# Patient Record
Sex: Female | Born: 1945 | Race: White | Hispanic: No | Marital: Married | Smoking: Former smoker
Health system: Southern US, Community
[De-identification: ages and names within clinical notes are randomized; demographics above are authoritative.]

## PROBLEM LIST (undated history)

## (undated) DIAGNOSIS — K589 Irritable bowel syndrome without diarrhea: Secondary | ICD-10-CM

## (undated) DIAGNOSIS — E119 Type 2 diabetes mellitus without complications: Secondary | ICD-10-CM

## (undated) DIAGNOSIS — N200 Calculus of kidney: Secondary | ICD-10-CM

## (undated) DIAGNOSIS — E7439 Other disorders of intestinal carbohydrate absorption: Secondary | ICD-10-CM

## (undated) DIAGNOSIS — N943 Premenstrual tension syndrome: Secondary | ICD-10-CM

## (undated) DIAGNOSIS — Z9889 Other specified postprocedural states: Secondary | ICD-10-CM

## (undated) DIAGNOSIS — T7840XA Allergy, unspecified, initial encounter: Secondary | ICD-10-CM

## (undated) DIAGNOSIS — J302 Other seasonal allergic rhinitis: Secondary | ICD-10-CM

## (undated) DIAGNOSIS — R51 Headache: Secondary | ICD-10-CM

## (undated) DIAGNOSIS — K219 Gastro-esophageal reflux disease without esophagitis: Secondary | ICD-10-CM

## (undated) DIAGNOSIS — E781 Pure hyperglyceridemia: Secondary | ICD-10-CM

## (undated) DIAGNOSIS — E049 Nontoxic goiter, unspecified: Secondary | ICD-10-CM

## (undated) DIAGNOSIS — R112 Nausea with vomiting, unspecified: Secondary | ICD-10-CM

## (undated) DIAGNOSIS — K5790 Diverticulosis of intestine, part unspecified, without perforation or abscess without bleeding: Secondary | ICD-10-CM

## (undated) DIAGNOSIS — K648 Other hemorrhoids: Secondary | ICD-10-CM

## (undated) DIAGNOSIS — I10 Essential (primary) hypertension: Secondary | ICD-10-CM

## (undated) DIAGNOSIS — K7689 Other specified diseases of liver: Secondary | ICD-10-CM

## (undated) DIAGNOSIS — Z90710 Acquired absence of both cervix and uterus: Secondary | ICD-10-CM

## (undated) DIAGNOSIS — M1990 Unspecified osteoarthritis, unspecified site: Secondary | ICD-10-CM

## (undated) HISTORY — DX: Type 2 diabetes mellitus without complications: E11.9

## (undated) HISTORY — DX: Premenstrual tension syndrome: N94.3

## (undated) HISTORY — DX: Other seasonal allergic rhinitis: J30.2

## (undated) HISTORY — DX: Nontoxic goiter, unspecified: E04.9

## (undated) HISTORY — DX: Gastro-esophageal reflux disease without esophagitis: K21.9

## (undated) HISTORY — DX: Other disorders of intestinal carbohydrate absorption: E74.39

## (undated) HISTORY — PX: BREAST BIOPSY: SHX20

## (undated) HISTORY — PX: ABDOMINAL HYSTERECTOMY: SHX81

## (undated) HISTORY — DX: Headache: R51

## (undated) HISTORY — DX: Other specified diseases of liver: K76.89

## (undated) HISTORY — DX: Pure hyperglyceridemia: E78.1

## (undated) HISTORY — DX: Acquired absence of both cervix and uterus: Z90.710

## (undated) HISTORY — DX: Allergy, unspecified, initial encounter: T78.40XA

## (undated) HISTORY — DX: Irritable bowel syndrome, unspecified: K58.9

## (undated) HISTORY — DX: Diverticulosis of intestine, part unspecified, without perforation or abscess without bleeding: K57.90

## (undated) HISTORY — PX: TONSILLECTOMY: SUR1361

## (undated) HISTORY — DX: Calculus of kidney: N20.0

## (undated) HISTORY — DX: Unspecified osteoarthritis, unspecified site: M19.90

## (undated) HISTORY — DX: Other hemorrhoids: K64.8

## (undated) HISTORY — PX: OTHER SURGICAL HISTORY: SHX169

## (undated) HISTORY — DX: Essential (primary) hypertension: I10

---

## 1973-07-20 HISTORY — PX: KIDNEY STONE SURGERY: SHX686

## 1983-07-21 HISTORY — PX: TOTAL ABDOMINAL HYSTERECTOMY W/ BILATERAL SALPINGOOPHORECTOMY: SHX83

## 1993-07-20 HISTORY — PX: TEMPOROMANDIBULAR JOINT SURGERY: SHX35

## 2001-10-18 ENCOUNTER — Ambulatory Visit (HOSPITAL_COMMUNITY): Admission: RE | Admit: 2001-10-18 | Discharge: 2001-10-18 | Payer: Self-pay | Admitting: Family Medicine

## 2001-10-18 ENCOUNTER — Encounter: Payer: Self-pay | Admitting: Family Medicine

## 2002-11-20 ENCOUNTER — Encounter: Payer: Self-pay | Admitting: Emergency Medicine

## 2002-11-20 ENCOUNTER — Emergency Department (HOSPITAL_COMMUNITY): Admission: EM | Admit: 2002-11-20 | Discharge: 2002-11-20 | Payer: Self-pay | Admitting: Emergency Medicine

## 2002-11-21 ENCOUNTER — Ambulatory Visit (HOSPITAL_BASED_OUTPATIENT_CLINIC_OR_DEPARTMENT_OTHER): Admission: RE | Admit: 2002-11-21 | Discharge: 2002-11-21 | Payer: Self-pay | Admitting: Urology

## 2005-03-18 ENCOUNTER — Ambulatory Visit: Payer: Self-pay | Admitting: Family Medicine

## 2005-03-25 ENCOUNTER — Ambulatory Visit: Payer: Self-pay | Admitting: Family Medicine

## 2005-04-07 ENCOUNTER — Ambulatory Visit: Payer: Self-pay | Admitting: Family Medicine

## 2005-07-20 DIAGNOSIS — K5790 Diverticulosis of intestine, part unspecified, without perforation or abscess without bleeding: Secondary | ICD-10-CM

## 2005-07-20 HISTORY — DX: Diverticulosis of intestine, part unspecified, without perforation or abscess without bleeding: K57.90

## 2005-09-15 ENCOUNTER — Ambulatory Visit: Payer: Self-pay | Admitting: Family Medicine

## 2006-10-24 ENCOUNTER — Ambulatory Visit (HOSPITAL_COMMUNITY): Admission: RE | Admit: 2006-10-24 | Discharge: 2006-10-24 | Payer: Self-pay | Admitting: Family Medicine

## 2007-02-16 ENCOUNTER — Encounter (INDEPENDENT_AMBULATORY_CARE_PROVIDER_SITE_OTHER): Payer: Self-pay | Admitting: *Deleted

## 2007-03-14 DIAGNOSIS — I10 Essential (primary) hypertension: Secondary | ICD-10-CM

## 2007-03-14 DIAGNOSIS — R51 Headache: Secondary | ICD-10-CM

## 2007-03-14 DIAGNOSIS — R519 Headache, unspecified: Secondary | ICD-10-CM | POA: Insufficient documentation

## 2007-03-14 DIAGNOSIS — K219 Gastro-esophageal reflux disease without esophagitis: Secondary | ICD-10-CM | POA: Insufficient documentation

## 2007-05-03 ENCOUNTER — Ambulatory Visit: Payer: Self-pay | Admitting: Family Medicine

## 2007-11-02 ENCOUNTER — Telehealth (INDEPENDENT_AMBULATORY_CARE_PROVIDER_SITE_OTHER): Payer: Self-pay | Admitting: *Deleted

## 2007-12-02 ENCOUNTER — Ambulatory Visit: Payer: Self-pay | Admitting: Family Medicine

## 2007-12-02 LAB — CONVERTED CEMR LAB
ALT: 34 units/L (ref 0–35)
AST: 28 units/L (ref 0–37)
Albumin: 3.9 g/dL (ref 3.5–5.2)
Alkaline Phosphatase: 63 units/L (ref 39–117)
BUN: 16 mg/dL (ref 6–23)
Basophils Absolute: 0 10*3/uL (ref 0.0–0.1)
Basophils Relative: 0.6 % (ref 0.0–1.0)
Bilirubin Urine: NEGATIVE
Bilirubin, Direct: 0.1 mg/dL (ref 0.0–0.3)
Blood in Urine, dipstick: NEGATIVE
CO2: 32 meq/L (ref 19–32)
Calcium: 9.2 mg/dL (ref 8.4–10.5)
Chloride: 106 meq/L (ref 96–112)
Cholesterol: 173 mg/dL (ref 0–200)
Creatinine, Ser: 0.7 mg/dL (ref 0.4–1.2)
Direct LDL: 96 mg/dL
Eosinophils Absolute: 0.2 10*3/uL (ref 0.0–0.7)
Eosinophils Relative: 2.4 % (ref 0.0–5.0)
GFR calc Af Amer: 110 mL/min
GFR calc non Af Amer: 91 mL/min
Glucose, Bld: 104 mg/dL — ABNORMAL HIGH (ref 70–99)
Glucose, Urine, Semiquant: NEGATIVE
HCT: 41 % (ref 36.0–46.0)
HDL: 30.6 mg/dL — ABNORMAL LOW (ref >39.0)
Hemoglobin: 13.7 g/dL (ref 12.0–15.0)
Ketones, urine, test strip: NEGATIVE
Lymphocytes Relative: 25.8 % (ref 12.0–46.0)
MCHC: 33.3 g/dL (ref 30.0–36.0)
MCV: 96.8 fL (ref 78.0–100.0)
Monocytes Absolute: 0.5 10*3/uL (ref 0.1–1.0)
Monocytes Relative: 6.7 % (ref 3.0–12.0)
Neutro Abs: 4.9 10*3/uL (ref 1.4–7.7)
Neutrophils Relative %: 64.5 % (ref 43.0–77.0)
Nitrite: NEGATIVE
Platelets: 220 10*3/uL (ref 150–400)
Potassium: 3.6 meq/L (ref 3.5–5.1)
Protein, U semiquant: NEGATIVE
RBC: 4.24 M/uL (ref 3.87–5.11)
RDW: 11.5 % (ref 11.5–14.6)
Sodium: 144 meq/L (ref 135–145)
Specific Gravity, Urine: 1.015
TSH: 1.59 microintl units/mL (ref 0.35–5.50)
Total Bilirubin: 0.9 mg/dL (ref 0.3–1.2)
Total CHOL/HDL Ratio: 5.7
Total Protein: 6.7 g/dL (ref 6.0–8.3)
Triglycerides: 231 mg/dL (ref 0–149)
Urobilinogen, UA: 0.2
VLDL: 46 mg/dL — ABNORMAL HIGH (ref 0–40)
WBC Urine, dipstick: NEGATIVE
WBC: 7.5 10*3/uL (ref 4.5–10.5)
pH: 7

## 2007-12-09 ENCOUNTER — Ambulatory Visit: Payer: Self-pay | Admitting: Family Medicine

## 2009-12-20 ENCOUNTER — Ambulatory Visit: Payer: Self-pay | Admitting: Family Medicine

## 2010-01-03 ENCOUNTER — Ambulatory Visit: Payer: Self-pay | Admitting: Family Medicine

## 2010-03-07 ENCOUNTER — Encounter: Payer: Self-pay | Admitting: Family Medicine

## 2010-08-17 LAB — CONVERTED CEMR LAB
ALT: 21 units/L (ref 0–35)
AST: 22 units/L (ref 0–37)
Albumin: 4.4 g/dL (ref 3.5–5.2)
Alkaline Phosphatase: 54 units/L (ref 39–117)
BUN: 15 mg/dL (ref 6–23)
Basophils Absolute: 0 10*3/uL (ref 0.0–0.1)
Basophils Relative: 0.6 % (ref 0.0–3.0)
Bilirubin Urine: NEGATIVE
Bilirubin, Direct: 0.1 mg/dL (ref 0.0–0.3)
CO2: 33 meq/L — ABNORMAL HIGH (ref 19–32)
Calcium: 9.7 mg/dL (ref 8.4–10.5)
Chloride: 103 meq/L (ref 96–112)
Cholesterol: 180 mg/dL (ref 0–200)
Creatinine, Ser: 0.6 mg/dL (ref 0.4–1.2)
Direct LDL: 105.6 mg/dL
Eosinophils Absolute: 0.2 10*3/uL (ref 0.0–0.7)
Eosinophils Relative: 1.9 % (ref 0.0–5.0)
GFR calc non Af Amer: 107.4 mL/min (ref >60)
Glucose, Bld: 80 mg/dL (ref 70–99)
Glucose, Urine, Semiquant: NEGATIVE
HCT: 40.9 % (ref 36.0–46.0)
HDL: 40.4 mg/dL (ref >39.00)
Hemoglobin: 14.2 g/dL (ref 12.0–15.0)
Ketones, urine, test strip: NEGATIVE
Lymphocytes Relative: 24.4 % (ref 12.0–46.0)
Lymphs Abs: 2.1 10*3/uL (ref 0.7–4.0)
MCHC: 34.7 g/dL (ref 30.0–36.0)
MCV: 96.9 fL (ref 78.0–100.0)
Monocytes Absolute: 0.6 10*3/uL (ref 0.1–1.0)
Monocytes Relative: 7.1 % (ref 3.0–12.0)
Neutro Abs: 5.7 10*3/uL (ref 1.4–7.7)
Neutrophils Relative %: 66 % (ref 43.0–77.0)
Nitrite: NEGATIVE
Platelets: 236 10*3/uL (ref 150.0–400.0)
Potassium: 3.5 meq/L (ref 3.5–5.1)
Protein, U semiquant: NEGATIVE
RBC: 4.22 M/uL (ref 3.87–5.11)
RDW: 12.7 % (ref 11.5–14.6)
Sodium: 145 meq/L (ref 135–145)
Specific Gravity, Urine: 1.01
TSH: 1.76 microintl units/mL (ref 0.35–5.50)
Total Bilirubin: 0.9 mg/dL (ref 0.3–1.2)
Total CHOL/HDL Ratio: 4
Total Protein: 7.2 g/dL (ref 6.0–8.3)
Triglycerides: 308 mg/dL — ABNORMAL HIGH (ref 0.0–149.0)
Urobilinogen, UA: 0.2
VLDL: 61.6 mg/dL — ABNORMAL HIGH (ref 0.0–40.0)
WBC Urine, dipstick: NEGATIVE
WBC: 8.6 10*3/uL (ref 4.5–10.5)
pH: 7

## 2010-08-21 NOTE — Assessment & Plan Note (Signed)
Summary: MED CK (REFILL) // RS   Vital Signs:  Patient profile:   65 year old female Height:      63 inches Weight:      158 pounds BMI:     28.09 Temp:     99.1 degrees F oral BP sitting:   110 / 80  (left arm) Cuff size:   regular  Vitals Entered By: Kern Reap CMA Duncan Dull) (December 20, 2009 11:18 AM) CC: follow-up visit    CC:  follow-up visit .  History of Present Illness: Michelle Spears is a 65 year old, widowed female, nonsmoker........... her husband ken died 5 years ago........ who comes in today for evaluation of hypertension.  She takes Tenoretic 50 to 25 one half tab daily BP 110/80.  Her last eye exam was two years ago, no dental care, she does BSE monthly.  However, last mammogram was 2006.  She's never had a colonoscopy.  Tetanus booster unknown.  We discussed her health maintenance activities in detail.  She states she has a new job and will soon have insurance.  She knows she needs the above done, however, has not done them because she didn't have the money  Allergies: No Known Drug Allergies  Review of Systems      See HPI  Physical Exam  General:  Well-developed,well-nourished,in no acute distress; alert,appropriate and cooperative throughout examination   Impression & Recommendations:  Problem # 1:  HYPERTENSION (ICD-401.9) Assessment Improved  Her updated medication list for this problem includes:    Atenolol-chlorthalidone 50-25 Mg Tabs (Atenolol-chlorthalidone) .Marland Kitchen... 1/2 tablet by mouth once in the am need cpx asap  Complete Medication List: 1)  Atenolol-chlorthalidone 50-25 Mg Tabs (Atenolol-chlorthalidone) .... 1/2 tablet by mouth once in the am need cpx asap 2)  Potassium Chloride Crys Cr 20 Meq Tbcr (Potassium chloride crys cr) .... Take 1 tablet by mouth every morning  Patient Instructions: 1)  continue your blood pressure medication. 2)  Return Friday, June 17 for a 30 minute appointment.  We will do your lab work the same day, you come  in Prescriptions: ATENOLOL-CHLORTHALIDONE 50-25 MG TABS (ATENOLOL-CHLORTHALIDONE) 1/2 tablet by mouth once in the am need cpx asap  #50 x 4   Entered and Authorized by:   Roderick Pee MD   Signed by:   Roderick Pee MD on 12/20/2009   Method used:   Electronically to        [facility]* (retail)       [address] [place], [place]  [address]       Ph: [phone]       Fax: [phone]   RxID:   [id number]

## 2010-08-21 NOTE — Assessment & Plan Note (Signed)
Summary: LABWORK & F/U PER DR Dezi Brauner // RS   Vital Signs:  Patient profile:   65 year old female Menstrual status:  hysterectomy Height:      63 inches Weight:      161 pounds Temp:     99.1 degrees F oral BP sitting:   130 / 90  (left arm) Cuff size:   regular  Vitals Entered By: Kern Reap CMA Duncan Dull) (January 03, 2010 10:01 AM) CC: follow-up visit     Menstrual Status hysterectomy   CC:  follow-up visit.  History of Present Illness: Michelle Spears is a 65 year old, widowed female, nonsmoker, who comes in today for evaluation of hypertension.  She takes Tenoretic 50 -- 25 dose one half tab daily for hypertension.  BP 130/90.  She gets routine eye care and dental care.  She does not check her breasts monthly and last mammogram was 7 years ago.  She's never had a colonoscopy.  Tetanus 2009.  She declines flu shots for unknown reasons.  Advised to get an annual flu shot.  Also asked her to call to get set up for screening mammogram and we will get her set up for a screening colonoscopy.  Allergies: No Known Drug Allergies  Past History:  Past medical, surgical, family and social histories (including risk factors) reviewed, and no changes noted (except as noted below).  Past Medical History: Reviewed history from 12/09/2007 and no changes required. GERD Headache Hypertension PMS TAB Abuse hysterectomy for nonmalignant reasons childbirth x 2 kidney stone diverticulitis thyroiditis  Past Surgical History: Reviewed history from 03/14/2007 and no changes required. TAH/BSO Childbirth x 2 TMJ Surg Kidney Stone-2004  Family History: Reviewed history from 03/14/2007 and no changes required. Father: MI,HBP.Obesity Mother:Hyperlipidemia Siblings: Hyperlipidemia  Social History: Reviewed history from 12/09/2007 and no changes required. Former Smoker Occupation: Alcohol use-no Drug use-no Regular exercise-yes husband can died two years ago from bacteremic shock  Review of  Systems      See HPI  Physical Exam  General:  Well-developed,well-nourished,in no acute distress; alert,appropriate and cooperative throughout examination Head:  Normocephalic and atraumatic without obvious abnormalities. No apparent alopecia or balding. Eyes:  No corneal or conjunctival inflammation noted. EOMI. Perrla. Funduscopic exam benign, without hemorrhages, exudates or papilledema. Vision grossly normal. Ears:  External ear exam shows no significant lesions or deformities.  Otoscopic examination reveals clear canals, tympanic membranes are intact bilaterally without bulging, retraction, inflammation or discharge. Hearing is grossly normal bilaterally. Nose:  External nasal examination shows no deformity or inflammation. Nasal mucosa are pink and moist without lesions or exudates. Mouth:  Oral mucosa and oropharynx without lesions or exudates.  Teeth in good repair. Neck:  No deformities, masses, or tenderness noted. Chest Wall:  No deformities, masses, or tenderness noted. Breasts:  No mass, nodules, thickening, tenderness, bulging, retraction, inflamation, nipple discharge or skin changes noted.   Lungs:  Normal respiratory effort, chest expands symmetrically. Lungs are clear to auscultation, no crackles or wheezes. Heart:  Normal rate and regular rhythm. S1 and S2 normal without gallop, murmur, click, rub or other extra sounds. Abdomen:  Bowel sounds positive,abdomen soft and non-tender without masses, organomegaly or hernias noted. Rectal:  No external abnormalities noted. Normal sphincter tone. No rectal masses or tenderness. Genitalia:  Pelvic Exam:        External: normal female genitalia without lesions or masses        Vagina: normal without lesions or masses        Cervix: normal without lesions  or masses        Adnexa: normal bimanual exam without masses or fullness        Uterus: normal by palpation        Pap smear: not performed Msk:  No deformity or scoliosis noted of  thoracic or lumbar spine.   Pulses:  R and L carotid,radial,femoral,dorsalis pedis and posterior tibial pulses are full and equal bilaterally Extremities:  No clubbing, cyanosis, edema, or deformity noted with normal full range of motion of all joints.   Neurologic:  No cranial nerve deficits noted. Station and gait are normal. Plantar reflexes are down-going bilaterally. DTRs are symmetrical throughout. Sensory, motor and coordinative functions appear intact. Skin:  Intact without suspicious lesions or rashes Cervical Nodes:  No lymphadenopathy noted Axillary Nodes:  No palpable lymphadenopathy Inguinal Nodes:  No significant adenopathy Psych:  Cognition and judgment appear intact. Alert and cooperative with normal attention span and concentration. No apparent delusions, illusions, hallucinations   Impression & Recommendations:  Problem # 1:  Preventive Health Care (ICD-V70.0) Assessment Unchanged  Problem # 2:  HYPERTENSION (ICD-401.9) Assessment: Improved  Her updated medication list for this problem includes:    Atenolol-chlorthalidone 50-25 Mg Tabs (Atenolol-chlorthalidone) .Marland Kitchen... 1/2 tablet by mouth once in the am need cpx asap  Orders: Venipuncture (91478) TLB-Lipid Panel (80061-LIPID) TLB-BMP (Basic Metabolic Panel-BMET) (80048-METABOL) TLB-CBC Platelet - w/Differential (85025-CBCD) TLB-Hepatic/Liver Function Pnl (80076-HEPATIC) TLB-TSH (Thyroid Stimulating Hormone) (29562-ZHY) Prescription Created Electronically [phone] UA Dipstick w/o Micro (automated)  (81003) EKG w/ Interpretation (93000)  Complete Medication List: 1)  Atenolol-chlorthalidone 50-25 Mg Tabs (Atenolol-chlorthalidone) .... 1/2 tablet by mouth once in the am need cpx asap  Other Orders: Gastroenterology Referral (GI)  Patient Instructions: 1)  continue current medication. 2)  Take one baby aspirin daily 3)  walk 20 minutes daily 4)  Schedule your mammogram. 5)  Schedule a colonoscopy/sigmoidoscopy  to help detect colon cancer. 6)  Take calcium +Vitamin D daily.    Laboratory Results   Urine Tests    Routine Urinalysis   Color: yellow Appearance: Clear Glucose: negative   (Normal Range: Negative) Bilirubin: negative   (Normal Range: Negative) Ketone: negative   (Normal Range: Negative) Spec. Gravity: 1.010   (Normal Range: 1.003-1.035) Blood: trace-intact   (Normal Range: Negative) pH: 7.0   (Normal Range: 5.0-8.0) Protein: negative   (Normal Range: Negative) Urobilinogen: 0.2   (Normal Range: 0-1) Nitrite: negative   (Normal Range: Negative) Leukocyte Esterace: negative   (Normal Range: Negative)    Comments: Rita Ohara  January 03, 2010 10:47 AM     Appended Document: LABWORK & F/U PER DR Ernestina Joe // RS please call labs normal  Appended Document: LABWORK & F/U PER DR Savas Elvin // RS left message on machine for patient

## 2010-08-21 NOTE — Letter (Signed)
Summary: Referral - not able to see patient  [facility]  [address] [place], [place] [address]   Phone: [phone]  Fax: [phone]    March 07, 2010    Tinnie Gens A. Tawanna Cooler, M.D. [address] [place], [place] [address]   Re:   Michelle Spears DOB:  January 10, 1946 MRN:   [id number]    Dear Dr. Tawanna Cooler:  Thank you for your kind referral of the above patient.  We have attempted to schedule the recommended procedure Screening Colonoscopy but have not been able to schedule because:   X  The patient was not available by phone and/or has not returned our calls.  ___ The patient declined to schedule the procedure at this time.  We appreciate the referral and hope that we will have the opportunity to treat this patient in the future.    Sincerely,    [facility] [phone]

## 2010-12-05 NOTE — Op Note (Signed)
NAME:  Michelle Spears, Michelle Spears                        ACCOUNT NO.:  [id number]   MEDICAL RECORD NO.:  [id number]                   PATIENT TYPE:  AMB   LOCATION:  [facility]                                 FACILITY:  [facility]   PHYSICIAN:  Jamison Neighbor, M.D.               DATE OF BIRTH:  April 09, 1946   DATE OF PROCEDURE:  11/21/2002  DATE OF DISCHARGE:                                 OPERATIVE REPORT   SERVICE:  Urology.   PREOPERATIVE DIAGNOSIS:  Left ureteral calculus with hydronephrosis.   POSTOPERATIVE DIAGNOSIS:  Left ureteral calculus with hydronephrosis.   PROCEDURE:  Cystoscopy, left retrograde ureteropyelogram including  interpretation, left ureteroscopy, left stone extraction and left double J  catheter insertion.   SURGEON:  Jamison Neighbor, M.D.   ANESTHESIA:  General.   COMPLICATIONS:  None.   DRAINS:  6 French x 26 cm double J catheter.   BRIEF HISTORY:  This 65 year old female presented to the emergency room on  Nov 20, 2002 with left sided pain and was found on evaluation to have a 7 mm  stone impacted at the junction of the middle third and bottom third of the  ureter. The patient has been unable to pass the stone and is now to undergo  ureteroscopy with basket extraction and possible in situ laser lithotripsy.  The patient understands the risks and benefits of the procedure and gave  full and informed consent.   DESCRIPTION OF PROCEDURE:  After successful induction of general anesthesia,  the patient was placed in the dorsal lithotomy position, prepped with  Betadine and draped in the usual sterile fashion. Cystoscopy was performed  and the bladder was carefully inspected and was free of any tumor or stones.  Both ureteral orifices were normal in configuration and location. Retrograde  on the left hand side showed a filling defect at the junction of the mid and  lower third of the ureter consistent with a stone. The upper tract showed  hydronephrosis but no filling  defect. A guidewire was passed up to the  kidney where it coiled normally in the upper pole. The distal ureter was  dilated with a 10 cm balloon dilator because this was a very tight and  stenotic ureteral opening. The cystoscope and the balloon dilator were  removed, the guidewire was left in place, the ureteroscope was inserted  along side the guidewire and advanced to the level of the stones, some clot  was washed away and the stone was well visualized. A nitinol basket was then  passed around the stone which was secured, the stone was extracted under  direct vision with no injury to the ureter. The ureteroscope was then  reintroduced and passed along side the guidewire all the way up into the  collecting system, no other stones could be seen and no abnormalities were  detected. The bladder was drained, the double J catheter was passed up to  the kidney where it was allowed to coil normally and the collecting system  as  well as within the bladder. The patient tolerated the procedure well and was  taken to the recovery room in good condition. She will use the pain medicine  she has at home, she will be given a small number of Pyridium plus to use  for any symptom management and will be sent home on antibiotics, one daily.                                               Jamison Neighbor, M.D.    RJE/MEDQ  D:  11/21/2002  T:  11/21/2002  Job:  [id number]

## 2011-04-28 ENCOUNTER — Telehealth: Payer: Self-pay | Admitting: Family Medicine

## 2011-04-28 NOTE — Telephone Encounter (Signed)
Pt stated she is going to [place] on 08/13/11. Pt has an appt on 04/30/11 and we will discuss immunizations then.

## 2011-04-28 NOTE — Telephone Encounter (Signed)
Pt will be traveling out of the country at the beginning of the year and had some questions about immunizations she would need to get. Please contact pt

## 2011-04-29 ENCOUNTER — Encounter: Payer: Self-pay | Admitting: Family Medicine

## 2011-04-30 ENCOUNTER — Ambulatory Visit (INDEPENDENT_AMBULATORY_CARE_PROVIDER_SITE_OTHER): Payer: 59 | Admitting: Family Medicine

## 2011-04-30 ENCOUNTER — Encounter: Payer: Self-pay | Admitting: Family Medicine

## 2011-04-30 DIAGNOSIS — I10 Essential (primary) hypertension: Secondary | ICD-10-CM

## 2011-04-30 NOTE — Progress Notes (Signed)
Subjective:    Patient ID: Michelle Spears, female    DOB: 01/20/1946, 65 y.o.   MRN: [id number]  HPI  Michelle Spears Is a 65 year old female, nonsmoker, who comes in today for reevaluation of hypertension.  She is on Tenoretic 50 -- 25 dose one half tab daily BP 122/76.  She feels well.  She is not lightheaded.  She states she is going on a mission trip.  The [place] and would like to know about vaccinations.  She would like to know what dose of Motrin to take for osteoarthritis.  Recommended 400 mg b.i.d. With food.    Review of Systems    General and cardiovascular review of systems otherwise negative Objective:   Physical Exam  Well-developed well-nourished, female in no acute distress.  BP right arm sitting position 122/76, pulse 70 and regular      Assessment & Plan:  Hypertension a goal continue current therapy.  Recommend she checked the [facility] website

## 2011-04-30 NOTE — Patient Instructions (Signed)
Continue the Tenoretic one half tablet daily.  Check with the CDC website to see what vaccinations, you need to go to [place].  Return annually for uric general physical exam, sooner if any problems

## 2011-05-04 ENCOUNTER — Other Ambulatory Visit: Payer: Self-pay | Admitting: Family Medicine

## 2011-06-29 ENCOUNTER — Telehealth: Payer: Self-pay | Admitting: Family Medicine

## 2011-06-29 NOTE — Telephone Encounter (Signed)
Pt is taking a trip to [place] in January and is needing to come in for tdap, mmr, and ?hep a inj. Pt has some questions re: vaccines and how far in advance does she need to have vaxs administered?

## 2011-07-01 NOTE — Telephone Encounter (Signed)
Spoke with patient and appointment made

## 2011-07-02 ENCOUNTER — Ambulatory Visit: Payer: 59 | Admitting: Family Medicine

## 2011-07-03 ENCOUNTER — Ambulatory Visit: Payer: 59 | Admitting: Family Medicine

## 2011-07-03 DIAGNOSIS — Z23 Encounter for immunization: Secondary | ICD-10-CM

## 2011-07-06 ENCOUNTER — Encounter: Payer: Self-pay | Admitting: Family Medicine

## 2011-07-06 NOTE — Progress Notes (Signed)
Subjective:    Patient ID: Michelle Spears, female    DOB: [birth year], 65 y.o.   MRN: [id number]  HPI Michelle Spears Is a 65 year old, married female, who comes in today to get a hepatitis A vaccine, because she's going overseas.  She is going to [place].  Hepatitis A was given booster in 6 months   Review of Systems    Noncontributory Objective:   Physical Exam No exam done vaccination only       Assessment & Plan:  Hepatitis A vaccine for foreign travel.  Return p.r.n.

## 2011-07-21 HISTORY — PX: COLONOSCOPY: SHX174

## 2011-08-29 ENCOUNTER — Emergency Department (INDEPENDENT_AMBULATORY_CARE_PROVIDER_SITE_OTHER): Payer: 59

## 2011-08-29 ENCOUNTER — Emergency Department (HOSPITAL_BASED_OUTPATIENT_CLINIC_OR_DEPARTMENT_OTHER)
Admission: EM | Admit: 2011-08-29 | Discharge: 2011-08-29 | Disposition: A | Discharge status: Home / Self Care | Payer: 59 | Attending: Emergency Medicine | Admitting: Emergency Medicine

## 2011-08-29 ENCOUNTER — Encounter (HOSPITAL_BASED_OUTPATIENT_CLINIC_OR_DEPARTMENT_OTHER): Payer: Self-pay | Admitting: Emergency Medicine

## 2011-08-29 DIAGNOSIS — R109 Unspecified abdominal pain: Secondary | ICD-10-CM | POA: Insufficient documentation

## 2011-08-29 DIAGNOSIS — R197 Diarrhea, unspecified: Secondary | ICD-10-CM

## 2011-08-29 DIAGNOSIS — I10 Essential (primary) hypertension: Secondary | ICD-10-CM | POA: Insufficient documentation

## 2011-08-29 DIAGNOSIS — Z79899 Other long term (current) drug therapy: Secondary | ICD-10-CM | POA: Insufficient documentation

## 2011-08-29 DIAGNOSIS — R112 Nausea with vomiting, unspecified: Secondary | ICD-10-CM | POA: Insufficient documentation

## 2011-08-29 DIAGNOSIS — K5730 Diverticulosis of large intestine without perforation or abscess without bleeding: Secondary | ICD-10-CM | POA: Insufficient documentation

## 2011-08-29 DIAGNOSIS — K7689 Other specified diseases of liver: Secondary | ICD-10-CM

## 2011-08-29 DIAGNOSIS — K219 Gastro-esophageal reflux disease without esophagitis: Secondary | ICD-10-CM | POA: Insufficient documentation

## 2011-08-29 LAB — COMPREHENSIVE METABOLIC PANEL
AST: 20 U/L (ref 0–37)
CO2: 27 mEq/L (ref 19–32)
Chloride: 97 mEq/L (ref 96–112)
Creatinine, Ser: 0.7 mg/dL (ref 0.50–1.10)
GFR calc non Af Amer: 90 mL/min — ABNORMAL LOW (ref >90)
Glucose, Bld: 134 mg/dL — ABNORMAL HIGH (ref 70–99)
Total Bilirubin: 0.8 mg/dL (ref 0.3–1.2)

## 2011-08-29 LAB — URINE MICROSCOPIC-ADD ON

## 2011-08-29 LAB — URINALYSIS, ROUTINE W REFLEX MICROSCOPIC
Bilirubin Urine: NEGATIVE
Nitrite: NEGATIVE
Specific Gravity, Urine: 1.025 (ref 1.005–1.030)
Urobilinogen, UA: 0.2 mg/dL (ref 0.0–1.0)
pH: 6.5 (ref 5.0–8.0)

## 2011-08-29 LAB — OCCULT BLOOD X 1 CARD TO LAB, STOOL: Fecal Occult Bld: NEGATIVE

## 2011-08-29 LAB — CBC
HCT: 39.9 % (ref 36.0–46.0)
Hemoglobin: 14.2 g/dL (ref 12.0–15.0)
RBC: 4.34 MIL/uL (ref 3.87–5.11)
RDW: 12.6 % (ref 11.5–15.5)
WBC: 9.1 10*3/uL (ref 4.0–10.5)

## 2011-08-29 LAB — CLOSTRIDIUM DIFFICILE BY PCR: Toxigenic C. Difficile by PCR: NEGATIVE

## 2011-08-29 LAB — DIFFERENTIAL
Basophils Absolute: 0 10*3/uL (ref 0.0–0.1)
Lymphocytes Relative: 6 % — ABNORMAL LOW (ref 12–46)
Lymphs Abs: 0.5 10*3/uL — ABNORMAL LOW (ref 0.7–4.0)
Monocytes Absolute: 0.5 10*3/uL (ref 0.1–1.0)
Monocytes Relative: 6 % (ref 3–12)
Neutro Abs: 8 10*3/uL — ABNORMAL HIGH (ref 1.7–7.7)

## 2011-08-29 LAB — LACTIC ACID, PLASMA: Lactic Acid, Venous: 1.5 mmol/L (ref 0.5–2.2)

## 2011-08-29 MED ORDER — IOHEXOL 300 MG/ML  SOLN
100.0000 mL | Freq: Once | INTRAMUSCULAR | Status: AC | PRN
  Administered 2011-08-29: 100 mL via INTRAVENOUS

## 2011-08-29 MED ORDER — IOHEXOL 300 MG/ML  SOLN
20.0000 mL | INTRAMUSCULAR | Status: AC
  Administered 2011-08-29 (×2): 20 mL via ORAL

## 2011-08-29 MED ORDER — ONDANSETRON HCL 4 MG/2ML IJ SOLN
4.0000 mg | Freq: Once | INTRAMUSCULAR | Status: AC
  Administered 2011-08-29: 4 mg via INTRAVENOUS
  Filled 2011-08-29: qty 2

## 2011-08-29 MED ORDER — SODIUM CHLORIDE 0.9 % IV BOLUS (SEPSIS)
1000.0000 mL | Freq: Once | INTRAVENOUS | Status: AC
  Administered 2011-08-29: 1000 mL via INTRAVENOUS

## 2011-08-29 MED ORDER — MORPHINE SULFATE 4 MG/ML IJ SOLN
4.0000 mg | Freq: Once | INTRAMUSCULAR | Status: AC
  Administered 2011-08-29: 4 mg via INTRAVENOUS
  Filled 2011-08-29: qty 1

## 2011-08-29 MED ORDER — PANTOPRAZOLE SODIUM 40 MG IV SOLR
40.0000 mg | Freq: Once | INTRAVENOUS | Status: AC
  Administered 2011-08-29: 40 mg via INTRAVENOUS
  Filled 2011-08-29: qty 40

## 2011-08-29 MED ORDER — CIPROFLOXACIN HCL 500 MG PO TABS: 500.0000 mg | ORAL_TABLET | Freq: Two times a day (BID) | ORAL | Status: AC

## 2011-08-29 NOTE — ED Notes (Signed)
Pt c/o NVD and abd pain since Wed - recently spent 10 days in [place] and flew back Wed

## 2011-08-29 NOTE — ED Notes (Signed)
Ginger Ale provided for po challenge 

## 2011-08-29 NOTE — ED Provider Notes (Signed)
History     CSN: [id number]  Arrival date & time 08/29/11  1043   First MD Initiated Contact with Patient 08/29/11 1059      Chief Complaint  Patient presents with  . Emesis  . Diarrhea  . Abdominal Pain    (Consider location/radiation/quality/duration/timing/severity/associated sxs/prior treatment) HPI Comments: Patient presents with nausea, vomiting, diarrhea abdominal pain for the past 3 days. She recently spent 10 days in [place] and return 2 days ago. She's had frequent loose stools yesterday without any blood. She estimates she had 10 episodes of diarrhea yesterday. This morning she had 3 episodes of vomiting. She felt warm but did not check her temperature.  Denies any dysuria hematuria. She states she drank bottled water in [place] and ate at well known restaurants.  Her roommate also had diarrhea.  The history is provided by the patient.    Past Medical History  Diagnosis Date  . GERD (gastroesophageal reflux disease)   . Headache   . Hypertension   . PMS (premenstrual syndrome)   . History of hysterectomy     nonmalignant reasons  . Kidney stone   . Diverticulitis   . Thyroiditis     Past Surgical History  Procedure Date  . Total abdominal hysterectomy w/ bilateral salpingoophorectomy   . Childbirthx2   . Temporomandibular joint surgery   . Kidney stone surgery 2004    Family History  Problem Relation Age of Onset  . Hyperlipidemia Mother   . Obesity Father   . Heart attack Father   . Hypertension Father   . Hyperlipidemia Other     History  Substance Use Topics  . Smoking status: Former Games developer  . Smokeless tobacco: Never Used  . Alcohol Use: Yes     occ.     OB History    Grav Para Term Preterm Abortions TAB SAB Ect Mult Living   2 2              Review of Systems  Constitutional: Positive for activity change, appetite change and fatigue. Negative for fever.  HENT: Negative for congestion and rhinorrhea.   Respiratory: Negative for  cough, chest tightness and shortness of breath.   Cardiovascular: Negative for chest pain.  Gastrointestinal: Positive for nausea, vomiting, abdominal pain and diarrhea.  Genitourinary: Negative for dysuria, hematuria, vaginal bleeding and vaginal discharge.  Musculoskeletal: Negative for back pain.  Skin: Negative for rash.  Neurological: Positive for weakness. Negative for dizziness and headaches.    Allergies  Review of patient's allergies indicates no known allergies.  Home Medications   Current Outpatient Rx  Name Route Sig Dispense Refill  . ATENOLOL-CHLORTHALIDONE 50-25 MG PO TABS  TAKE ONE-HALF TABLET BY MOUTH IN THE MORNING 90 tablet 2  . CALCIUM PO Oral Take by mouth daily.      Marland Kitchen CIPROFLOXACIN HCL 500 MG PO TABS Oral Take 1 tablet (500 mg total) by mouth 2 (two) times daily. 14 tablet 0  . ONE-DAILY MULTI VITAMINS PO TABS Oral Take 1 tablet by mouth daily.        BP 137/72  Pulse 86  Temp(Src) 98.3 F (36.8 C) (Oral)  Resp 18  SpO2 98%  Physical Exam  Constitutional: She is oriented to person, place, and time. She appears well-developed and well-nourished. No distress.  HENT:  Head: Normocephalic and atraumatic.  Mouth/Throat: Oropharynx is clear and moist. No oropharyngeal exudate.  Eyes: Conjunctivae are normal. Pupils are equal, round, and reactive to light.  Neck: Normal range  of motion.  Cardiovascular: Normal rate, regular rhythm and normal heart sounds.   Pulmonary/Chest: Effort normal and breath sounds normal. No respiratory distress.  Abdominal: Soft. There is tenderness. There is no rebound and no guarding.       Mild upper abdominal tenderness  Musculoskeletal: Normal range of motion. She exhibits no edema and no tenderness.  Neurological: She is alert and oriented to person, place, and time. No cranial nerve deficit.  Skin: Skin is warm.    ED Course  Procedures (including critical care time)  Labs Reviewed  DIFFERENTIAL - Abnormal; Notable for  the following:    Neutrophils Relative 88 (*)    Neutro Abs 8.0 (*)    Lymphocytes Relative 6 (*)    Lymphs Abs 0.5 (*)    All other components within normal limits  COMPREHENSIVE METABOLIC PANEL - Abnormal; Notable for the following:    Potassium 3.1 (*)    Glucose, Bld 134 (*)    GFR calc non Af Amer 90 (*)    All other components within normal limits  URINALYSIS, ROUTINE W REFLEX MICROSCOPIC - Abnormal; Notable for the following:    Color, Urine AMBER (*) BIOCHEMICALS MAY BE AFFECTED BY COLOR   Protein, ur 30 (*)    Leukocytes, UA TRACE (*)    All other components within normal limits  URINE MICROSCOPIC-ADD ON - Abnormal; Notable for the following:    Bacteria, UA FEW (*)    All other components within normal limits  CBC  LIPASE, BLOOD  LACTIC ACID, PLASMA  OCCULT BLOOD X 1 CARD TO LAB, STOOL  CLOSTRIDIUM DIFFICILE BY PCR  STOOL CULTURE   Ct Abdomen Pelvis W Contrast  08/29/2011  *RADIOLOGY REPORT*  Clinical Data: Abdominal pain, diarrhea  CT ABDOMEN AND PELVIS WITH CONTRAST  Technique:  Multidetector CT imaging of the abdomen and pelvis was performed following the standard protocol during bolus administration of intravenous contrast.  Contrast: OMNIPAQUE IOHEXOL 300 MG/ML IV SOLN  Comparison: 10/24/2006  Findings: Sagittal images of the spine shows degenerative changes lower thoracic spine.  Stable about 2 mm anterolisthesis L4 on L5 vertebral body.  Lung bases are unremarkable.  Enhanced liver is unremarkable.  A cyst in the dome of the left hepatic lobe laterally measures 3.5 x 3 cm stable in size and appearance from prior exam.  No intrahepatic biliary ductal dilatation.  No calcified gallstones are noted within gallbladder.  The pancreas, spleen and adrenal glands are unremarkable.  Kidneys are symmetrical in size and enhancement.  There is no nephrolithiasis identified.  No hydronephrosis or hydroureter.  Delayed renal images shows bilateral renal symmetrical excretion.  Oral  contrast material was given to the patient.  There is no small bowel obstruction.  There is no pericecal inflammation.  Normal appendix is only partially visualized in axial image 62.  Nonspecific lymph nodes are noted in the right lower quadrant mesentery.  Largest measures nine by 7 mm not pathologic by size criteria.  The terminal ileum is unremarkable.  No colonic obstruction. Multiple sigmoid colon diverticula are noted without evidence of acute diverticulitis.  There is  small left inguinal hernia containing fat without evidence of acute complication.  The urinary bladder is unremarkable.  The sigmoid colon is empty collapsed.  No pelvic ascites or adenopathy.  The patient is status post hysterectomy.  No destructive bony lesions are noted.  Degenerative changes bilateral SI joints. Small left retroperitoneal lymph node adjacent to aorta measures 6.5 x 6 mm not pathologic  by size criteria.  IMPRESSION:  1.  No acute inflammatory process within abdomen or pelvis. 2.  No small bowel obstruction. 3.  Normal appendix only partially visualized. 4.  Status post hysterectomy. 5.  Sigmoid colon diverticula are noted without evidence of acute diverticulitis. 6.  Stable cyst in the left hepatic dome.  Original Report Authenticated By: Natasha Mead, M.D.     1. Diarrhea       MDM  Nausea, vomiting, diarrhea abdominal pain. Recent international travel.  Electrolytes reviewed without abnormality.  Normal creatinine. CT scan is not show any evidence of bowel suction, diverticulitis or colitis. We'll treat empirically for travelers diarrhea. Patient tolerating by mouth in the ED.  Stool sample obtained before discharge.       Glynn Octave, MD 08/29/11 [phone]

## 2011-09-02 LAB — STOOL CULTURE

## 2012-01-29 ENCOUNTER — Encounter: Payer: Self-pay | Admitting: Internal Medicine

## 2012-01-29 ENCOUNTER — Telehealth: Payer: Self-pay | Admitting: Family Medicine

## 2012-01-29 ENCOUNTER — Ambulatory Visit (INDEPENDENT_AMBULATORY_CARE_PROVIDER_SITE_OTHER): Payer: 59 | Admitting: Internal Medicine

## 2012-01-29 VITALS — BP 136/90 | HR 87 | Temp 98.8°F | Wt 162.0 lb

## 2012-01-29 DIAGNOSIS — R350 Frequency of micturition: Secondary | ICD-10-CM

## 2012-01-29 DIAGNOSIS — N390 Urinary tract infection, site not specified: Secondary | ICD-10-CM

## 2012-01-29 LAB — POCT URINALYSIS DIPSTICK
Bilirubin, UA: NEGATIVE
Blood, UA: 1
Glucose, UA: NEGATIVE
Protein, UA: NEGATIVE
Spec Grav, UA: 1.015
Urobilinogen, UA: 0.2
pH, UA: 6.5

## 2012-01-29 MED ORDER — SULFAMETHOXAZOLE-TRIMETHOPRIM 800-160 MG PO TABS: 1.0000 | ORAL_TABLET | Freq: Two times a day (BID) | ORAL | Status: DC

## 2012-01-29 NOTE — Progress Notes (Signed)
Subjective:    Patient ID: Michelle Spears, female    DOB: March 26, 1946, 66 y.o.   MRN: [id number]  HPI Patient comes in today for SDA for  new problem evaluation. PCp not in office. Onset 3 days ago of urinary frequency especially in the evening with and voiding dysuria and urgency. No incontinence. No hematuria.  Last UTI was a number of years ago. Had cystitis when she was 20 or 30 years younger but no history of pyelonephritis. Has a remote history of renal stones 2004 and 1975 both needed removal.  Was out of the country in [place] in February and did have a significant GI infection and had IV hydration in the hospital and possibly some type of antibiotic. Otherwise has been well.  Review of Systems Negative for fever chills nausea vomiting unusual rashes. Past history family history social history reviewed in the electronic medical record. No antibiotic allergies    Objective:   Physical Exam  BP 136/90  Pulse 87  Temp 98.8 F (37.1 C) (Oral)  Wt 162 lb (73.483 kg)  SpO2 97% Well-developed well-nourished in no acute distress looks well nontoxic. Abdomen:  Sof,t normal bowel sounds without hepatosplenomegaly, no guarding rebound or masses no CVA tenderness mild suprapubic tenderness   UA tr leuk 1+ blood     Assessment & Plan:  Probable acute uncomplicated UTI.  History of renal stones history of foreign travel with GI illnesses in the last 6 months.  Possible risk for resistant germs.  Rx Septra DS 1 by mouth twice a day for 3-5 days culture pending will contact her in the meantime followup if persistent or progressive symptoms. Or other concerns.

## 2012-01-29 NOTE — Telephone Encounter (Signed)
Caller: Tenna/Patient; PCP: Roderick Pee.; CB#: [phone];  Call regarding Urinary Pain; sx started 01/26/12; increase in frequency; burning with urination; having pressure after she urinates;  no fever;  no vaginal discharge; Triaged per Urinary Symptoms-Female Guideline; See in 24 hr d/t has uti sx and has not been evaluated; appt made for 1:30pm today Dr Fabian Sharp; will comply

## 2012-01-29 NOTE — Patient Instructions (Signed)
Take the antibiotic as directed.  We'll contact you when the culture results are back. However use if you're not getting better contact us.  This acts like a typical bladder infection. Should get better with an antibiotic.  Urinary Tract Infection Infections of the urinary tract can start in several places. A bladder infection (cystitis), a kidney infection (pyelonephritis), and a prostate infection (prostatitis) are different types of urinary tract infections (UTIs). They usually get better if treated with medicines (antibiotics) that kill germs. Take all the medicine until it is gone. You or your child may feel better in a few days, but TAKE ALL MEDICINE or the infection may not respond and may become more difficult to treat. HOME CARE INSTRUCTIONS   Drink enough water and fluids to keep the urine clear or pale yellow. Cranberry juice is especially recommended, in addition to large amounts of water.   Avoid caffeine, tea, and carbonated beverages. They tend to irritate the bladder.   Alcohol may irritate the prostate.   Only take over-the-counter or prescription medicines for pain, discomfort, or fever as directed by your caregiver.  To prevent further infections:  Empty the bladder often. Avoid holding urine for long periods of time.   After a bowel movement, women should cleanse from front to back. Use each tissue only once.   Empty the bladder before and after sexual intercourse.  FINDING OUT THE RESULTS OF YOUR TEST Not all test results are available during your visit. If your or your child's test results are not back during the visit, make an appointment with your caregiver to find out the results. Do not assume everything is normal if you have not heard from your caregiver or the medical facility. It is important for you to follow up on all test results. SEEK MEDICAL CARE IF:   There is back pain.   Your baby is older than 3 months with a rectal temperature of 100.5 F (38.1 C)  or higher for more than 1 day.   Your or your child's problems (symptoms) are no better in 3 days. Return sooner if you or your child is getting worse.  SEEK IMMEDIATE MEDICAL CARE IF:   There is severe back pain or lower abdominal pain.   You or your child develops chills.   You have a fever.   Your baby is older than 3 months with a rectal temperature of 102 F (38.9 C) or higher.   Your baby is 23 months old or younger with a rectal temperature of 100.4 F (38 C) or higher.   There is nausea or vomiting.   There is continued burning or discomfort with urination.  MAKE SURE YOU:   Understand these instructions.   Will watch your condition.   Will get help right away if you are not doing well or get worse.  Document Released: 04/15/2005 Document Revised: 06/25/2011 Document Reviewed: 11/18/2006 [facility] Patient Information 2012 [place], [place].

## 2012-02-02 ENCOUNTER — Other Ambulatory Visit: Payer: Self-pay | Admitting: *Deleted

## 2012-02-02 ENCOUNTER — Telehealth: Payer: Self-pay | Admitting: Family Medicine

## 2012-02-02 LAB — URINE CULTURE: Colony Count: >=100000

## 2012-02-02 MED ORDER — SULFAMETHOXAZOLE-TRIMETHOPRIM 800-160 MG PO TABS: 1.0000 | ORAL_TABLET | Freq: Two times a day (BID) | ORAL | Status: DC

## 2012-02-02 MED ORDER — CEPHALEXIN 500 MG PO CAPS: 500.0000 mg | ORAL_CAPSULE | Freq: Two times a day (BID) | ORAL | Status: AC

## 2012-02-02 NOTE — Telephone Encounter (Signed)
Rx sent to pharmacy and patient is aware 

## 2012-02-02 NOTE — Telephone Encounter (Signed)
Caller: Michelle Spears/Patient; PCP: Roderick Pee.; CB#: [phone]; ; ; Call regarding Urinary symptoms Pt states she was seen last week for UTI and is on her last day of Septra. She c/o of continued  pressure with urination. She also has external vaginal itching at night. Emergent s/s of Urinary s/s protocol r/o. Pt to see provider within 24hrs. Pt would like physician recommendations and whether she may need a different antibiotic. Pharmacy is [facility]

## 2012-02-02 NOTE — Telephone Encounter (Signed)
Michelle Spears refill her Septra give her 20 tabs one by mouth twice a day for 10 days  She can get OTC Monistat apply twice daily for vaginal itching

## 2012-02-16 ENCOUNTER — Telehealth: Payer: Self-pay | Admitting: Family Medicine

## 2012-02-16 NOTE — Telephone Encounter (Signed)
Take the medicine until the bottle is empty

## 2012-02-16 NOTE — Telephone Encounter (Signed)
Patient is aware 

## 2012-02-16 NOTE — Telephone Encounter (Signed)
Caller: Jamayah/Patient; PCP: Roderick Pee.;; Calling To Check To See If She Is Supposed To Take Keflex for 20 Days To Treat E. Coli; Sx improved-02/16/12. She has 7 days left to finish RX. PLEASE CHECK WITH Dr. Tawanna Cooler AND LET HER KNOW IF SHE NEEDS TO CONTINUE.  CB#: [phone]

## 2012-04-07 ENCOUNTER — Ambulatory Visit (INDEPENDENT_AMBULATORY_CARE_PROVIDER_SITE_OTHER): Payer: Medicare Other | Admitting: Family Medicine

## 2012-04-07 ENCOUNTER — Encounter: Payer: Self-pay | Admitting: Family Medicine

## 2012-04-07 VITALS — BP 130/90 | Temp 98.8°F | Ht 63.5 in | Wt 164.0 lb

## 2012-04-07 DIAGNOSIS — K219 Gastro-esophageal reflux disease without esophagitis: Secondary | ICD-10-CM

## 2012-04-07 DIAGNOSIS — I10 Essential (primary) hypertension: Secondary | ICD-10-CM

## 2012-04-07 DIAGNOSIS — Z0000 Encounter for general adult medical examination without abnormal findings: Secondary | ICD-10-CM

## 2012-04-07 DIAGNOSIS — N952 Postmenopausal atrophic vaginitis: Secondary | ICD-10-CM

## 2012-04-07 DIAGNOSIS — N760 Acute vaginitis: Secondary | ICD-10-CM

## 2012-04-07 DIAGNOSIS — Z23 Encounter for immunization: Secondary | ICD-10-CM

## 2012-04-07 LAB — BASIC METABOLIC PANEL
BUN: 20 mg/dL (ref 6–23)
CO2: 28 mEq/L (ref 19–32)
Chloride: 102 mEq/L (ref 96–112)
Creatinine, Ser: 0.6 mg/dL (ref 0.4–1.2)

## 2012-04-07 LAB — POCT URINALYSIS DIPSTICK
Ketones, UA: NEGATIVE
Protein, UA: NEGATIVE
Spec Grav, UA: 1.02
Urobilinogen, UA: 0.2
pH, UA: 7

## 2012-04-07 LAB — CBC WITH DIFFERENTIAL/PLATELET
Basophils Relative: 0.3 % (ref 0.0–3.0)
Eosinophils Absolute: 0.2 10*3/uL (ref 0.0–0.7)
Eosinophils Relative: 2.3 % (ref 0.0–5.0)
HCT: 41.1 % (ref 36.0–46.0)
Lymphs Abs: 1.9 10*3/uL (ref 0.7–4.0)
MCHC: 33.3 g/dL (ref 30.0–36.0)
MCV: 96.3 fl (ref 78.0–100.0)
Monocytes Absolute: 0.6 10*3/uL (ref 0.1–1.0)
Platelets: 214 10*3/uL (ref 150.0–400.0)
WBC: 7.7 10*3/uL (ref 4.5–10.5)

## 2012-04-07 LAB — HEPATIC FUNCTION PANEL
ALT: 34 U/L (ref 0–35)
AST: 32 U/L (ref 0–37)
Bilirubin, Direct: 0.1 mg/dL (ref 0.0–0.3)
Total Bilirubin: 0.8 mg/dL (ref 0.3–1.2)

## 2012-04-07 LAB — LIPID PANEL
Total CHOL/HDL Ratio: 5
Triglycerides: 233 mg/dL — ABNORMAL HIGH (ref 0.0–149.0)

## 2012-04-07 MED ORDER — ESTROGENS, CONJUGATED 0.625 MG/GM VA CREA: TOPICAL_CREAM | Freq: Every day | VAGINAL | Status: DC

## 2012-04-07 MED ORDER — ATENOLOL-CHLORTHALIDONE 50-25 MG PO TABS: ORAL_TABLET | ORAL | Status: DC

## 2012-04-07 MED ORDER — OMEPRAZOLE 20 MG PO CPDR: 20.0000 mg | DELAYED_RELEASE_CAPSULE | Freq: Every day | ORAL | Status: DC

## 2012-04-07 NOTE — Patient Instructions (Signed)
Continue current medications  For the pain in your wrist he can take 400 mg of Motrin twice daily with food elevation ice when necessary. If however this does not work then I would recommend a hand consult with Dr. Molly Maduro Cypher  Do a thorough breast exam monthly and call today and get set up for mammogram  We will call to get she's had her first screening colonoscopy  Call your insurance company to find out the shingles vaccine  Return in one year sooner if any problems  You small amounts of the hormonal cream 3 times weekly

## 2012-04-07 NOTE — Progress Notes (Signed)
Subjective:    Patient ID: Michelle Spears, female    DOB: [birth year], 66 y.o.   MRN: [id number]  HPI Michelle Spears is a 66 year old recently remarried female nonsmoker who comes in today for her first Medicare wellness examination  She has a history of underlying hypertension and takes Tenoretic one half tab daily BP 130/90  She takes Prilosec daily because of a history of chronic reflux esophagitis  She broke her right wrist when she was 66 years of age and now is stiff and sore.  She also has occasional pain in her right hip that goes to her right groin  She recently got remarried and would like some hormonal cream.  She gets routine eye care, dental care, does not check her breasts monthly, last mammogram 10 years ago, never had a colonoscopy  Tetanus booster 2009, seasonal flu shot and Pneumovax today.  Cognitive function normal she walks on a regular basis home health safety reviewed no issues identified, no guns in the house, she does have a health care power of attorney and living will   Review of Systems  Constitutional: Negative.   HENT: Negative.   Eyes: Negative.   Respiratory: Negative.   Cardiovascular: Negative.   Gastrointestinal: Negative.   Genitourinary: Negative.   Musculoskeletal: Positive for back pain and arthralgias.  Neurological: Negative.   Hematological: Negative.   Psychiatric/Behavioral: Negative.        Objective:   Physical Exam  Constitutional: She appears well-developed and well-nourished.  HENT:  Head: Normocephalic and atraumatic.  Right Ear: External ear normal.  Left Ear: External ear normal.  Nose: Nose normal.  Mouth/Throat: Oropharynx is clear and moist.  Eyes: EOM are normal. Pupils are equal, round, and reactive to light.  Neck: Normal range of motion. Neck supple. No thyromegaly present.  Cardiovascular: Normal rate, regular rhythm, normal heart sounds and intact distal pulses.  Exam reveals no gallop and no friction rub.   No  murmur heard. Pulmonary/Chest: Effort normal and breath sounds normal.  Abdominal: Soft. Bowel sounds are normal. She exhibits no distension and no mass. There is no tenderness. There is no rebound.  Genitourinary: Vagina normal. Guaiac negative stool. No vaginal discharge found.       Bilateral breast exam normal she was advised to do BSE monthly and get a mammogram stat  Musculoskeletal: Normal range of motion.  Lymphadenopathy:    She has no cervical adenopathy.  Neurological: She is alert. She has normal reflexes. No cranial nerve deficit. She exhibits normal muscle tone. Coordination normal.  Skin: Skin is warm and dry.  Psychiatric: She has a normal mood and affect. Her behavior is normal. Judgment and thought content normal.          Assessment & Plan:  Healthy female  Hypertension continue Tenoretic one half tab daily  Chronic reflux esophagitis continue Prilosec  Postmenopausal vaginal dryness Premarin vaginal cream 2-3 times weekly  Pain right wrist Motrin 400 twice a day elevation and ice hand consult when necessary  Degenerative disc disease exercise program return when necessary

## 2012-04-11 ENCOUNTER — Other Ambulatory Visit: Payer: Self-pay | Admitting: Family Medicine

## 2012-04-11 DIAGNOSIS — Z1231 Encounter for screening mammogram for malignant neoplasm of breast: Secondary | ICD-10-CM

## 2012-04-12 ENCOUNTER — Encounter: Payer: Self-pay | Admitting: Internal Medicine

## 2012-05-04 ENCOUNTER — Ambulatory Visit
Admission: RE | Admit: 2012-05-04 | Discharge: 2012-05-04 | Disposition: A | Discharge status: Home / Self Care | Payer: Medicare Other | Source: Ambulatory Visit | Attending: Family Medicine | Admitting: Family Medicine

## 2012-05-04 DIAGNOSIS — Z1231 Encounter for screening mammogram for malignant neoplasm of breast: Secondary | ICD-10-CM

## 2012-05-11 ENCOUNTER — Ambulatory Visit (INDEPENDENT_AMBULATORY_CARE_PROVIDER_SITE_OTHER): Payer: Medicare Other | Admitting: Internal Medicine

## 2012-05-11 ENCOUNTER — Encounter: Payer: Self-pay | Admitting: Internal Medicine

## 2012-05-11 VITALS — BP 140/76 | HR 64 | Ht 63.0 in | Wt 164.5 lb

## 2012-05-11 DIAGNOSIS — Z1211 Encounter for screening for malignant neoplasm of colon: Secondary | ICD-10-CM | POA: Diagnosis not present

## 2012-05-11 DIAGNOSIS — K219 Gastro-esophageal reflux disease without esophagitis: Secondary | ICD-10-CM | POA: Diagnosis not present

## 2012-05-11 MED ORDER — NA SULFATE-K SULFATE-MG SULF 17.5-3.13-1.6 GM/177ML PO SOLN: ORAL | Status: DC

## 2012-05-11 NOTE — Patient Instructions (Addendum)
You have been scheduled for a colonoscopy with propofol. Please follow written instructions given to you at your visit today.  Please pick up your prep kit at the pharmacy within the next 1-3 days. If you use inhalers (even only as needed), please bring them with you on the day of your procedure.  You have been given information on GERD life style changes to try and make.  Thank you for choosing me and [facility].  Iva Boop, M.D., [facility]

## 2012-05-11 NOTE — Progress Notes (Signed)
Subjective:    Patient ID: Michelle Spears, female    DOB: 02/16/1946, 65 y.o.   MRN: [id number]  HPI This very nice lady presents for screening colonoscopy scheduling but also has some issues of heartburn that she wanted to discuss. She's been on omeprazole for the last 6 months and for the most part does not have heartburn but every 3 or 4 weeks she'll have a week where she'll have increasing heartburn. Antacids to help with that some. There is no particular pattern to this were obvious trigger. She uses 1/2 cups of coffee and rare soda with caffeine. She is not a smoker. She has gained some weight and realizes that may be part of the problem and states a goal to lose 15 pounds. She's been busy with recent marriage and moving into her husband's home. So she think stress might be contributing. She also question if stopping gluten could help headaches, she has a history of migraines or not as bad as they once were but has some mild intermittent headaches and her daughter has stopped using gluten and indicated that that helped her headaches. The patient does not have irritable bowel syndrome-like issues of bloating or diarrhea which is typical for celiac disease. She denies dysphagia. There is no significant abdominal pain. Her lower GI review of systems is negative, i.e. no bowel habit changes abdominal pain bleeding.  Medications, allergies, past medical history, past surgical history, family history and social history are reviewed and updated in the EMR.  Review of Systems Is taking some Benadryl to help minimize some Allergy, it's mostly when the cast not sure she has some   Objective:   Physical Exam General:  Well-developed, well-nourished and in no acute distress- mildly obese Eyes:  anicteric. ENT:   Mouth and posterior pharynx free of lesions.  Neck:   supple w/o thyromegaly or mass.  Lungs: Clear to auscultation bilaterally. Heart:  S1S2, no rubs, murmurs, gallops. Abdomen:  soft,  non-tender, no hepatosplenomegaly, hernia, or mass and BS+.  Lymph:  no cervical or supraclavicular adenopathy. Extremities:   no edema Skin   no rash. Neuro:  A&O x 3.  Psych:  appropriate mood and  Affect.       Assessment & Plan:   1. GERD (gastroesophageal reflux disease)   2. Special screening for malignant neoplasms, colon    1. She will work on lifestyle changes for GERD at this point. A handout was provided. I agreed with a supported her goal to lose weight. 2. Schedule screening colonoscopy.The risks and benefits as well as alternatives of endoscopic procedure(s) have been discussed and reviewed. All questions answered. The patient agrees to proceed.  CC: TODD,JEFFREY ALLEN, MD

## 2012-05-24 ENCOUNTER — Encounter: Payer: Self-pay | Admitting: Internal Medicine

## 2012-05-24 ENCOUNTER — Ambulatory Visit (AMBULATORY_SURGERY_CENTER): Payer: Medicare Other | Admitting: Internal Medicine

## 2012-05-24 VITALS — BP 136/79 | HR 76 | Temp 99.4°F | Resp 15 | Ht 63.6 in | Wt 164.0 lb

## 2012-05-24 DIAGNOSIS — K5730 Diverticulosis of large intestine without perforation or abscess without bleeding: Secondary | ICD-10-CM

## 2012-05-24 DIAGNOSIS — I10 Essential (primary) hypertension: Secondary | ICD-10-CM | POA: Diagnosis not present

## 2012-05-24 DIAGNOSIS — Z1211 Encounter for screening for malignant neoplasm of colon: Secondary | ICD-10-CM

## 2012-05-24 DIAGNOSIS — K648 Other hemorrhoids: Secondary | ICD-10-CM

## 2012-05-24 DIAGNOSIS — F329 Major depressive disorder, single episode, unspecified: Secondary | ICD-10-CM | POA: Diagnosis not present

## 2012-05-24 DIAGNOSIS — K219 Gastro-esophageal reflux disease without esophagitis: Secondary | ICD-10-CM | POA: Diagnosis not present

## 2012-05-24 DIAGNOSIS — E079 Disorder of thyroid, unspecified: Secondary | ICD-10-CM | POA: Diagnosis not present

## 2012-05-24 MED ORDER — SODIUM CHLORIDE 0.9 % IV SOLN: 500.0000 mL | INTRAVENOUS | Status: DC

## 2012-05-24 NOTE — Op Note (Signed)
[facility] [address] [place], [address]   COLONOSCOPY PROCEDURE REPORT  PATIENT: Michelle, Spears  MR#: [id number] BIRTHDATE: Dec 16, 1945 , 66  yrs. old GENDER: Female ENDOSCOPIST: Iva Boop, MD, [facility] REFERRED VH:[id number] Shawnie Dapper, M.D. PROCEDURE DATE:  05/24/2012 PROCEDURE:   Colonoscopy, screening ASA CLASS:   Class II INDICATIONS:average risk screening. MEDICATIONS: Propofol (Diprivan) 180 mg IV, MAC sedation, administered by CRNA, and These medications were titrated to patient response per physician's verbal order  DESCRIPTION OF PROCEDURE:   After the risks benefits and alternatives of the procedure were thoroughly explained, informed consent was obtained.  A digital rectal exam revealed no abnormalities of the rectum.   The LB [id number]  endoscope was introduced through the anus and advanced to the cecum, which was identified by both the appendix and ileocecal valve. No adverse events experienced.   The quality of the prep was Suprep excellent The instrument was then slowly withdrawn as the colon was fully examined.      COLON FINDINGS: Severe diverticulosis was noted The finding was in the left colon.   Small internal hemorrhoids were found.   The colon mucosa was otherwise normal incuding right colon retroflexion.  Retroflexed views revealed internal hemorrhoids. The time to cecum=2 minutes 45 seconds.  Withdrawal time=6 minutes 52 seconds.  The scope was withdrawn and the procedure completed. COMPLICATIONS: There were no complications.  ENDOSCOPIC IMPRESSION: 1.   Severe diverticulosis was noted in the left colon 2.   Small internal hemorrhoids 3.   The colon mucosa was otherwise normal  RECOMMENDATIONS: Repeat Colonscopy in 10 years.   eSigned:  Iva Boop, MD, [facility] 05/24/2012 2:27 PM   cc: Roderick Pee, MD and The Patient

## 2012-05-24 NOTE — Progress Notes (Signed)
Patient did not have preoperative order for IV antibiotic SSI prophylaxis. ([id number])   

## 2012-05-24 NOTE — Patient Instructions (Addendum)
No polyps were seen. You do have diverticulosis and hemorrhoids. The patient information will explain more.  Next routine colonoscopy in 10 years - 2023.  Thank you for choosing me and [facility].  Iva Boop, MD, FACG YOU HAD AN ENDOSCOPIC PROCEDURE TODAY AT THE [facility]: Refer to the procedure report that was given to you for any specific questions about what was found during the examination.  If the procedure report does not answer your questions, please call your gastroenterologist to clarify.  If you requested that your care partner not be given the details of your procedure findings, then the procedure report has been included in a sealed envelope for you to review at your convenience later.  YOU SHOULD EXPECT: Some feelings of bloating in the abdomen. Passage of more gas than usual.  Walking can help get rid of the air that was put into your GI tract during the procedure and reduce the bloating. If you had a lower endoscopy (such as a colonoscopy or flexible sigmoidoscopy) you may notice spotting of blood in your stool or on the toilet paper. If you underwent a bowel prep for your procedure, then you may not have a normal bowel movement for a few days.  DIET: Your first meal following the procedure should be a light meal and then it is ok to progress to your normal diet.  A half-sandwich or bowl of soup is an example of a good first meal.  Heavy or fried foods are harder to digest and may make you feel nauseous or bloated.  Likewise meals heavy in dairy and vegetables can cause extra gas to form and this can also increase the bloating.  Drink plenty of fluids but you should avoid alcoholic beverages for 24 hours.  ACTIVITY: Your care partner should take you home directly after the procedure.  You should plan to take it easy, moving slowly for the rest of the day.  You can resume normal activity the day after the procedure however you should NOT DRIVE or use  heavy machinery for 24 hours (because of the sedation medicines used during the test).    SYMPTOMS TO REPORT IMMEDIATELY: A gastroenterologist can be reached at any hour.  During normal business hours, 8:30 AM to 5:00 PM Monday through Friday, call [phone].  After hours and on weekends, please call the GI answering service at [phone] who will take a message and have the physician on call contact you.   Following lower endoscopy (colonoscopy or flexible sigmoidoscopy):  Excessive amounts of blood in the stool  Significant tenderness or worsening of abdominal pains  Swelling of the abdomen that is new, acute  Fever of 100F or higher FOLLOW UP: If any biopsies were taken you will be contacted by phone or by letter within the next 1-3 weeks.  Call your gastroenterologist if you have not heard about the biopsies in 3 weeks.  Our staff will call the home number listed on your records the next business day following your procedure to check on you and address any questions or concerns that you may have at that time regarding the information given to you following your procedure. This is a courtesy call and so if there is no answer at the home number and we have not heard from you through the emergency physician on call, we will assume that you have returned to your regular daily activities without incident.  SIGNATURES/CONFIDENTIALITY: You and/or your care partner have signed paperwork which  will be entered into your electronic medical record.  These signatures attest to the fact that that the information above on your After Visit Summary has been reviewed and is understood.  Full responsibility of the confidentiality of this discharge information lies with you and/or your care-partner.

## 2012-05-25 ENCOUNTER — Telehealth: Payer: Self-pay | Admitting: *Deleted

## 2012-05-25 NOTE — Telephone Encounter (Signed)
  Follow up Call-  Call back number 05/24/2012  Post procedure Call Back phone  # [phone]  Permission to leave phone message Yes     Patient questions:  Do you have a fever, pain , or abdominal swelling? no Pain Score  0 *  Have you tolerated food without any problems? yes  Have you been able to return to your normal activities? yes  Do you have any questions about your discharge instructions: Diet   no Medications  no Follow up visit  no  Do you have questions or concerns about your Care? no  Actions: * If pain score is 4 or above: No action needed, pain <4.

## 2012-06-27 DIAGNOSIS — H35039 Hypertensive retinopathy, unspecified eye: Secondary | ICD-10-CM | POA: Diagnosis not present

## 2012-06-27 DIAGNOSIS — H2510 Age-related nuclear cataract, unspecified eye: Secondary | ICD-10-CM | POA: Diagnosis not present

## 2012-06-28 ENCOUNTER — Encounter: Payer: Self-pay | Admitting: Family Medicine

## 2012-07-04 DIAGNOSIS — M25539 Pain in unspecified wrist: Secondary | ICD-10-CM | POA: Diagnosis not present

## 2012-11-02 ENCOUNTER — Other Ambulatory Visit: Payer: Self-pay | Admitting: *Deleted

## 2012-11-02 DIAGNOSIS — N952 Postmenopausal atrophic vaginitis: Secondary | ICD-10-CM

## 2012-11-02 DIAGNOSIS — N760 Acute vaginitis: Secondary | ICD-10-CM

## 2012-11-02 DIAGNOSIS — I10 Essential (primary) hypertension: Secondary | ICD-10-CM

## 2012-11-02 DIAGNOSIS — K219 Gastro-esophageal reflux disease without esophagitis: Secondary | ICD-10-CM

## 2012-11-02 DIAGNOSIS — Z23 Encounter for immunization: Secondary | ICD-10-CM

## 2012-11-02 MED ORDER — ATENOLOL-CHLORTHALIDONE 50-25 MG PO TABS: ORAL_TABLET | ORAL | Status: DC

## 2012-11-08 ENCOUNTER — Other Ambulatory Visit: Payer: Self-pay | Admitting: *Deleted

## 2012-11-08 DIAGNOSIS — Z23 Encounter for immunization: Secondary | ICD-10-CM

## 2012-11-08 DIAGNOSIS — N952 Postmenopausal atrophic vaginitis: Secondary | ICD-10-CM

## 2012-11-08 DIAGNOSIS — N760 Acute vaginitis: Secondary | ICD-10-CM

## 2012-11-08 DIAGNOSIS — I10 Essential (primary) hypertension: Secondary | ICD-10-CM

## 2012-11-08 DIAGNOSIS — K219 Gastro-esophageal reflux disease without esophagitis: Secondary | ICD-10-CM

## 2012-11-08 MED ORDER — ATENOLOL-CHLORTHALIDONE 50-25 MG PO TABS: ORAL_TABLET | ORAL | Status: DC

## 2012-12-29 DIAGNOSIS — M65979 Unspecified synovitis and tenosynovitis, unspecified ankle and foot: Secondary | ICD-10-CM | POA: Diagnosis not present

## 2012-12-29 DIAGNOSIS — M79609 Pain in unspecified limb: Secondary | ICD-10-CM | POA: Diagnosis not present

## 2012-12-29 DIAGNOSIS — M659 Synovitis and tenosynovitis, unspecified: Secondary | ICD-10-CM | POA: Diagnosis not present

## 2012-12-29 DIAGNOSIS — M25579 Pain in unspecified ankle and joints of unspecified foot: Secondary | ICD-10-CM | POA: Diagnosis not present

## 2013-01-12 DIAGNOSIS — Z79899 Other long term (current) drug therapy: Secondary | ICD-10-CM | POA: Diagnosis not present

## 2013-01-12 DIAGNOSIS — M25519 Pain in unspecified shoulder: Secondary | ICD-10-CM | POA: Diagnosis not present

## 2013-01-12 DIAGNOSIS — M67919 Unspecified disorder of synovium and tendon, unspecified shoulder: Secondary | ICD-10-CM | POA: Diagnosis not present

## 2013-01-12 DIAGNOSIS — M719 Bursopathy, unspecified: Secondary | ICD-10-CM | POA: Diagnosis not present

## 2013-01-18 ENCOUNTER — Ambulatory Visit: Payer: Medicare Other | Attending: Family Medicine | Admitting: Physical Therapy

## 2013-01-18 DIAGNOSIS — IMO0001 Reserved for inherently not codable concepts without codable children: Secondary | ICD-10-CM | POA: Diagnosis not present

## 2013-01-18 DIAGNOSIS — M25539 Pain in unspecified wrist: Secondary | ICD-10-CM | POA: Diagnosis not present

## 2013-01-18 DIAGNOSIS — M25519 Pain in unspecified shoulder: Secondary | ICD-10-CM | POA: Insufficient documentation

## 2013-01-25 ENCOUNTER — Ambulatory Visit: Payer: Medicare Other | Admitting: Rehabilitation

## 2013-01-30 DIAGNOSIS — L821 Other seborrheic keratosis: Secondary | ICD-10-CM | POA: Diagnosis not present

## 2013-02-01 ENCOUNTER — Ambulatory Visit: Payer: Medicare Other | Admitting: Physical Therapy

## 2013-02-08 ENCOUNTER — Ambulatory Visit: Payer: Medicare Other | Admitting: Physical Therapy

## 2013-02-15 ENCOUNTER — Ambulatory Visit: Payer: Medicare Other | Admitting: Physical Therapy

## 2013-02-22 ENCOUNTER — Ambulatory Visit: Payer: Medicare Other | Attending: Family Medicine | Admitting: Physical Therapy

## 2013-02-22 DIAGNOSIS — IMO0001 Reserved for inherently not codable concepts without codable children: Secondary | ICD-10-CM | POA: Insufficient documentation

## 2013-02-22 DIAGNOSIS — M25539 Pain in unspecified wrist: Secondary | ICD-10-CM | POA: Diagnosis not present

## 2013-02-22 DIAGNOSIS — M25519 Pain in unspecified shoulder: Secondary | ICD-10-CM | POA: Diagnosis not present

## 2013-03-01 ENCOUNTER — Ambulatory Visit: Payer: Medicare Other | Admitting: Physical Therapy

## 2013-03-02 ENCOUNTER — Ambulatory Visit: Payer: Medicare Other | Admitting: Physical Therapy

## 2013-03-08 ENCOUNTER — Ambulatory Visit: Payer: Medicare Other | Admitting: Physical Therapy

## 2013-03-22 ENCOUNTER — Ambulatory Visit: Payer: Medicare Other | Attending: Family Medicine | Admitting: Physical Therapy

## 2013-03-22 DIAGNOSIS — M25539 Pain in unspecified wrist: Secondary | ICD-10-CM | POA: Diagnosis not present

## 2013-03-22 DIAGNOSIS — M25519 Pain in unspecified shoulder: Secondary | ICD-10-CM | POA: Insufficient documentation

## 2013-03-22 DIAGNOSIS — IMO0001 Reserved for inherently not codable concepts without codable children: Secondary | ICD-10-CM | POA: Diagnosis not present

## 2013-04-04 ENCOUNTER — Encounter: Payer: Self-pay | Admitting: Family

## 2013-04-10 ENCOUNTER — Ambulatory Visit (INDEPENDENT_AMBULATORY_CARE_PROVIDER_SITE_OTHER): Payer: Medicare Other | Admitting: Family

## 2013-04-10 ENCOUNTER — Encounter: Payer: Self-pay | Admitting: Family

## 2013-04-10 VITALS — BP 126/82 | HR 76 | Ht 63.0 in | Wt 168.0 lb

## 2013-04-10 DIAGNOSIS — R739 Hyperglycemia, unspecified: Secondary | ICD-10-CM

## 2013-04-10 DIAGNOSIS — Z23 Encounter for immunization: Secondary | ICD-10-CM | POA: Diagnosis not present

## 2013-04-10 DIAGNOSIS — Z780 Asymptomatic menopausal state: Secondary | ICD-10-CM

## 2013-04-10 DIAGNOSIS — Z0000 Encounter for general adult medical examination without abnormal findings: Secondary | ICD-10-CM

## 2013-04-10 DIAGNOSIS — E669 Obesity, unspecified: Secondary | ICD-10-CM | POA: Diagnosis not present

## 2013-04-10 DIAGNOSIS — R7309 Other abnormal glucose: Secondary | ICD-10-CM | POA: Diagnosis not present

## 2013-04-10 DIAGNOSIS — K219 Gastro-esophageal reflux disease without esophagitis: Secondary | ICD-10-CM | POA: Diagnosis not present

## 2013-04-10 LAB — CBC WITH DIFFERENTIAL/PLATELET
Basophils Relative: 0.2 % (ref 0.0–3.0)
Eosinophils Relative: 3.2 % (ref 0.0–5.0)
Lymphocytes Relative: 21.6 % (ref 12.0–46.0)
MCV: 93.7 fl (ref 78.0–100.0)
Monocytes Absolute: 0.6 10*3/uL (ref 0.1–1.0)
Monocytes Relative: 7.7 % (ref 3.0–12.0)
Neutrophils Relative %: 67.3 % (ref 43.0–77.0)
Platelets: 213 10*3/uL (ref 150.0–400.0)
RBC: 4.27 Mil/uL (ref 3.87–5.11)
WBC: 7.6 10*3/uL (ref 4.5–10.5)

## 2013-04-10 LAB — BASIC METABOLIC PANEL
BUN: 20 mg/dL (ref 6–23)
CO2: 31 mEq/L (ref 19–32)
Chloride: 105 mEq/L (ref 96–112)
Creatinine, Ser: 0.6 mg/dL (ref 0.4–1.2)
Glucose, Bld: 108 mg/dL — ABNORMAL HIGH (ref 70–99)
Potassium: 3.9 mEq/L (ref 3.5–5.1)

## 2013-04-10 LAB — LIPID PANEL
HDL: 36.7 mg/dL — ABNORMAL LOW (ref >39.00)
Triglycerides: 197 mg/dL — ABNORMAL HIGH (ref 0.0–149.0)

## 2013-04-10 LAB — HEPATIC FUNCTION PANEL
ALT: 28 U/L (ref 0–35)
AST: 23 U/L (ref 0–37)
Albumin: 3.9 g/dL (ref 3.5–5.2)
Total Protein: 6.8 g/dL (ref 6.0–8.3)

## 2013-04-10 LAB — HEMOGLOBIN A1C: Hgb A1c MFr Bld: 5.9 % (ref 4.6–6.5)

## 2013-04-10 MED ORDER — GLUCOSE BLOOD VI STRP: ORAL_STRIP | Status: DC

## 2013-04-10 NOTE — Progress Notes (Signed)
Subjective:    Patient ID: Michelle Spears, female    DOB: [birth year], 67 y.o.   MRN: [id number]  HPI  This is a routine physical examination for this healthy  Female. Reviewed all health maintenance protocols including mammography colonoscopy bone density and reviewed appropriate screening labs. Her immunization history was reviewed as well as her current medications and allergies refills of her chronic medications were given and the plan for yearly health maintenance was discussed all orders and referrals were made as appropriate.  Patient has been checking her blood glucoses at home with her husband meter and if that ranges between 149-278. She is not officially diagnosed diabetic. Denies any polyuria or polydipsia.  Review of Systems  Constitutional: Negative.   HENT: Negative.   Eyes: Negative.   Respiratory: Negative.   Cardiovascular: Negative.   Gastrointestinal: Negative.   Endocrine: Negative.   Genitourinary: Negative.   Musculoskeletal: Negative.   Skin: Negative.   Allergic/Immunologic: Negative.   Neurological: Negative.   Hematological: Negative.   Psychiatric/Behavioral: Negative.    Past Medical History  Diagnosis Date  . GERD (gastroesophageal reflux disease)   . Headache(784.0)   . Hypertension   . PMS (premenstrual syndrome)   . History of hysterectomy     nonmalignant reasons  . Kidney stone 1975/2004  . Diverticulosis 2007  . Thyroid goiter     hx of  . Spastic colon     History   Social History  . Marital Status: Married    Spouse Name: N/A    Number of Children: 2  . Years of Education: N/A   Occupational History  . retired    Social History Main Topics  . Smoking status: Former Smoker -- 0.30 packs/day for 12 years    Types: Cigarettes    Quit date: 07/20/2004  . Smokeless tobacco: Never Used  . Alcohol Use: Yes     Comment: occ.   . Drug Use: No  . Sexual Activity: Not on file   Other Topics Concern  . Not on file   Social  History Narrative  . No narrative on file    Past Surgical History  Procedure Laterality Date  . Total abdominal hysterectomy w/ bilateral salpingoophorectomy  1985  . Childbirthx2    . Temporomandibular joint surgery  1995  . Kidney stone surgery  1975  . Bladder stent      for stones  . Bladder stent removal    . Tonsillectomy      Family History  Problem Relation Age of Onset  . Hyperlipidemia Mother   . Obesity Father   . Heart attack Father   . Hypertension Father   . Hyperlipidemia Other     siblings    No Known Allergies  Current Outpatient Prescriptions on File Prior to Visit  Medication Sig Dispense Refill  . aspirin 81 MG tablet Take 81 mg by mouth daily.      Marland Kitchen atenolol-chlorthalidone (TENORETIC) 50-25 MG per tablet One half tab every morning  90 tablet  0  . CALCIUM PO Take by mouth daily.        . Omega-3 Fatty Acids (FISH OIL) 600 MG CAPS Take by mouth.      Marland Kitchen omeprazole (PRILOSEC) 20 MG capsule Take 1 capsule (20 mg total) by mouth daily.  100 capsule  3  . Probiotic Product (ADVANCED PROBIOTIC 10 PO) Take by mouth.      . conjugated estrogens (PREMARIN) vaginal cream Place vaginally daily.  42.5 g  12  . diphenhydrAMINE (BENADRYL) 25 MG tablet Take 25 mg by mouth daily.      . Multiple Vitamin (MULTIVITAMIN) tablet Take 1 tablet by mouth daily.         No current facility-administered medications on file prior to visit.    BP 126/82  Pulse 76  Ht 5\' 3"  (1.6 m)  Wt 168 lb (76.204 kg)  BMI 29.77 kg/m2chart    Objective:   Physical Exam  Constitutional: She is oriented to person, place, and time. She appears well-developed and well-nourished.  HENT:  Head: Normocephalic.  Right Ear: External ear normal.  Left Ear: External ear normal.  Nose: Nose normal.  Mouth/Throat: Oropharynx is clear and moist.  Eyes: Conjunctivae and EOM are normal. Pupils are equal, round, and reactive to light.  Neck: Normal range of motion. Neck supple.   Cardiovascular: Normal rate, regular rhythm and normal heart sounds.   Pulmonary/Chest: Effort normal and breath sounds normal.  Abdominal: Soft. Bowel sounds are normal.  Musculoskeletal: Normal range of motion. She exhibits no edema and no tenderness.  Neurological: She is alert and oriented to person, place, and time. She has normal reflexes.  Skin: Skin is warm and dry.  Psychiatric: She has a normal mood and affect.          Assessment & Plan:   1. Complete physical exam 2. Hypertension 3. GERD  4. Hyperglycemia   Plan: Continue current medications. Labs sent to include A1c, lipid, CBC, TSH, BMP and LFTs will notify patient of results. Encouraged healthy diet, exercise, monthly self breast exams.Consider metformin if blood sugars are too high.

## 2013-04-10 NOTE — Patient Instructions (Signed)

## 2013-04-10 NOTE — Addendum Note (Signed)
Addended byAdline Mango B on: 04/10/2013 10:05 AM   Modules accepted: Orders

## 2013-04-10 NOTE — Addendum Note (Signed)
Addended by: Beverely Low on: 04/10/2013 03:24 PM   Modules accepted: Orders

## 2013-04-11 ENCOUNTER — Other Ambulatory Visit: Payer: Self-pay | Admitting: Family

## 2013-04-11 DIAGNOSIS — R739 Hyperglycemia, unspecified: Secondary | ICD-10-CM

## 2013-04-12 ENCOUNTER — Telehealth: Payer: Self-pay

## 2013-04-12 DIAGNOSIS — R739 Hyperglycemia, unspecified: Secondary | ICD-10-CM

## 2013-04-12 MED ORDER — GLUCOSE BLOOD VI STRP: ORAL_STRIP | Status: DC

## 2013-04-12 NOTE — Telephone Encounter (Signed)
correct test stirps ordered and diabetic educator referral placed

## 2013-04-19 ENCOUNTER — Encounter: Payer: Self-pay | Admitting: Family

## 2013-04-19 ENCOUNTER — Other Ambulatory Visit: Payer: Self-pay

## 2013-04-19 DIAGNOSIS — N760 Acute vaginitis: Secondary | ICD-10-CM

## 2013-04-19 DIAGNOSIS — Z23 Encounter for immunization: Secondary | ICD-10-CM

## 2013-04-19 DIAGNOSIS — N952 Postmenopausal atrophic vaginitis: Secondary | ICD-10-CM

## 2013-04-19 DIAGNOSIS — K219 Gastro-esophageal reflux disease without esophagitis: Secondary | ICD-10-CM

## 2013-04-19 DIAGNOSIS — I10 Essential (primary) hypertension: Secondary | ICD-10-CM

## 2013-04-19 MED ORDER — GLUCOSE BLOOD VI STRP: ORAL_STRIP | Status: DC

## 2013-04-19 MED ORDER — ATENOLOL-CHLORTHALIDONE 50-25 MG PO TABS: ORAL_TABLET | ORAL | Status: DC

## 2013-04-22 IMAGING — MG MM DIGITAL SCREENING BILAT
4 series · 4 of 4 positions shown · non-contrast
Comparison: Previous exams.

CLINICAL DATA: Screening.

DIGITAL BILATERAL SCREENING MAMMOGRAM WITH CAD

[R CC]
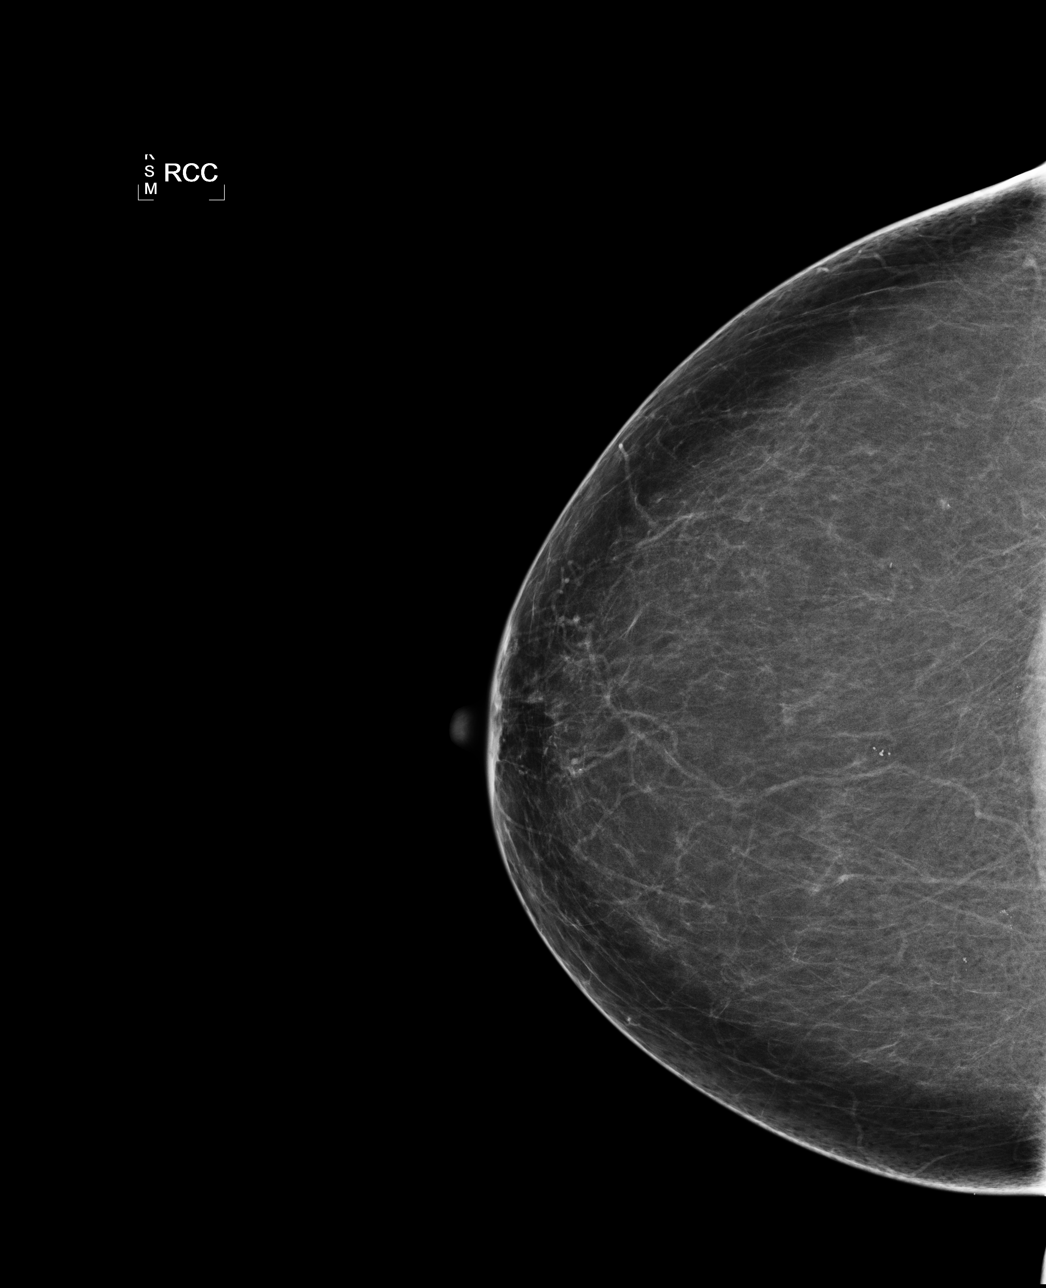

[L CC]
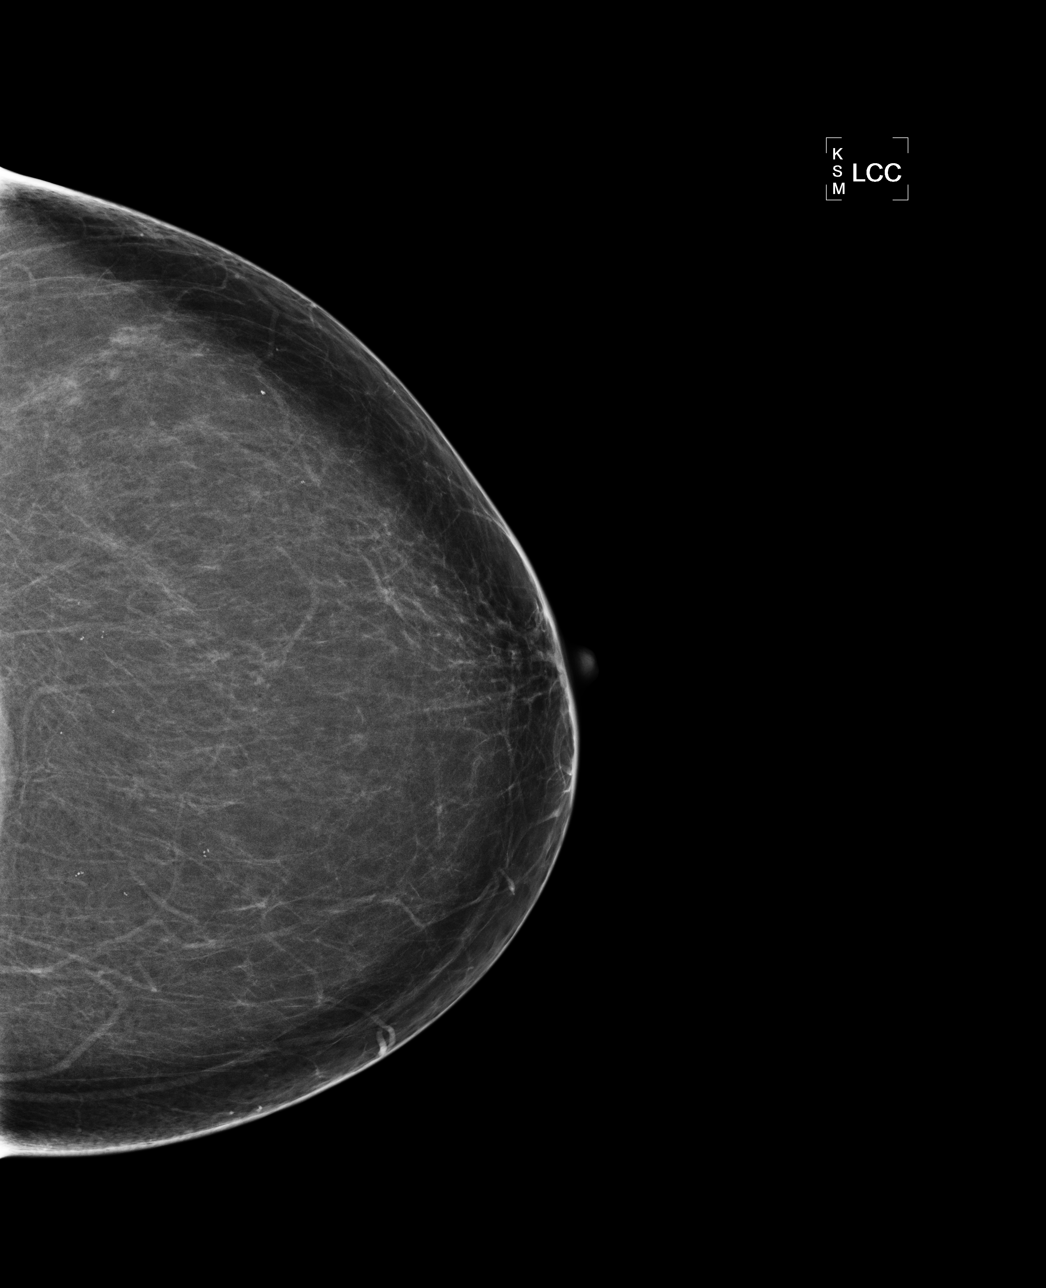

[L MLO]
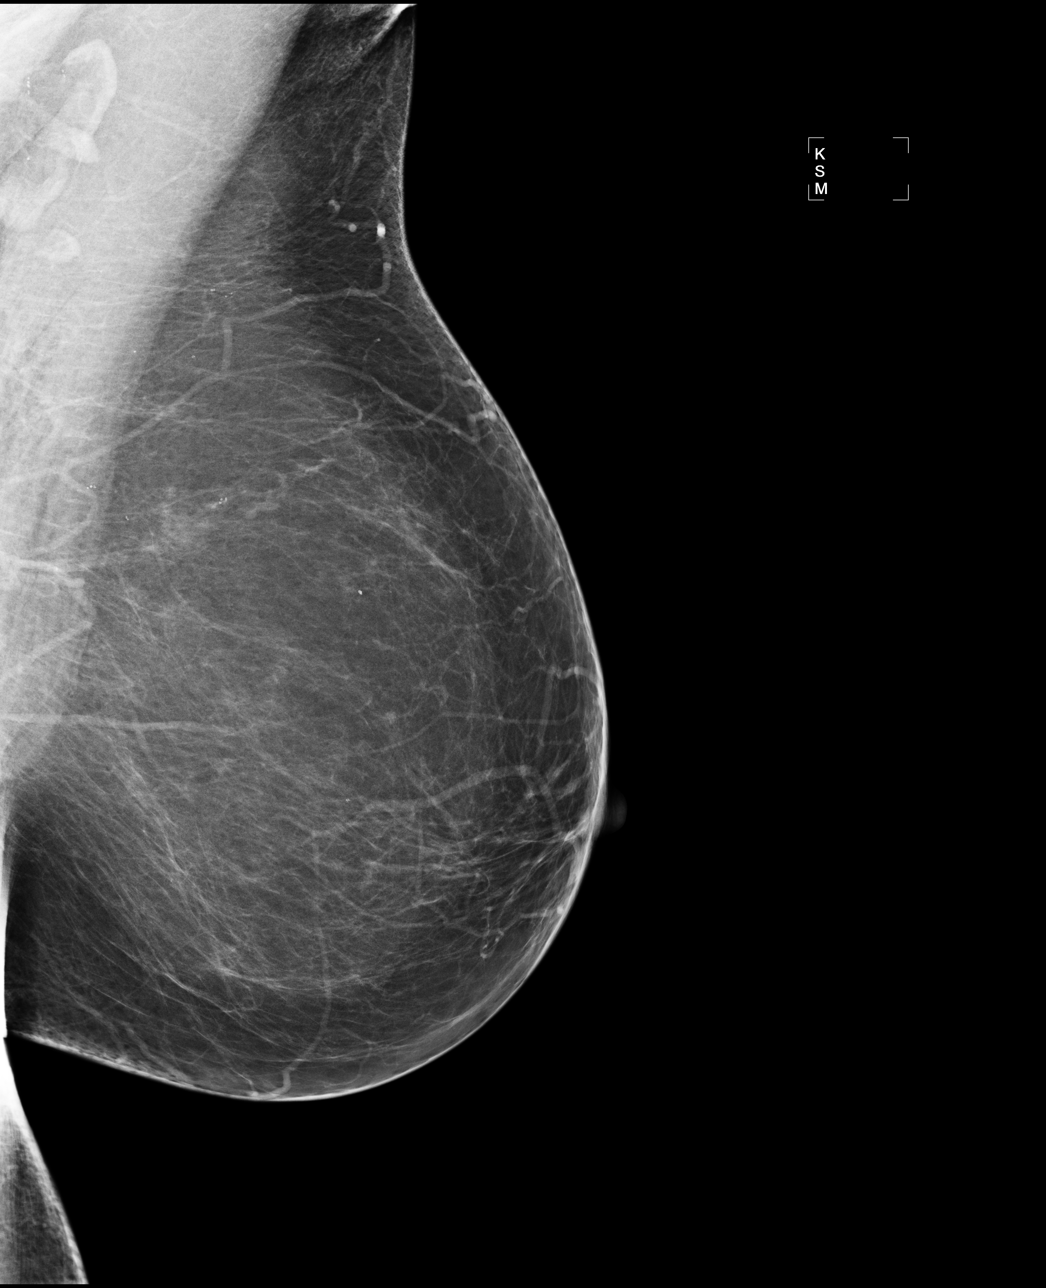

[R MLO]
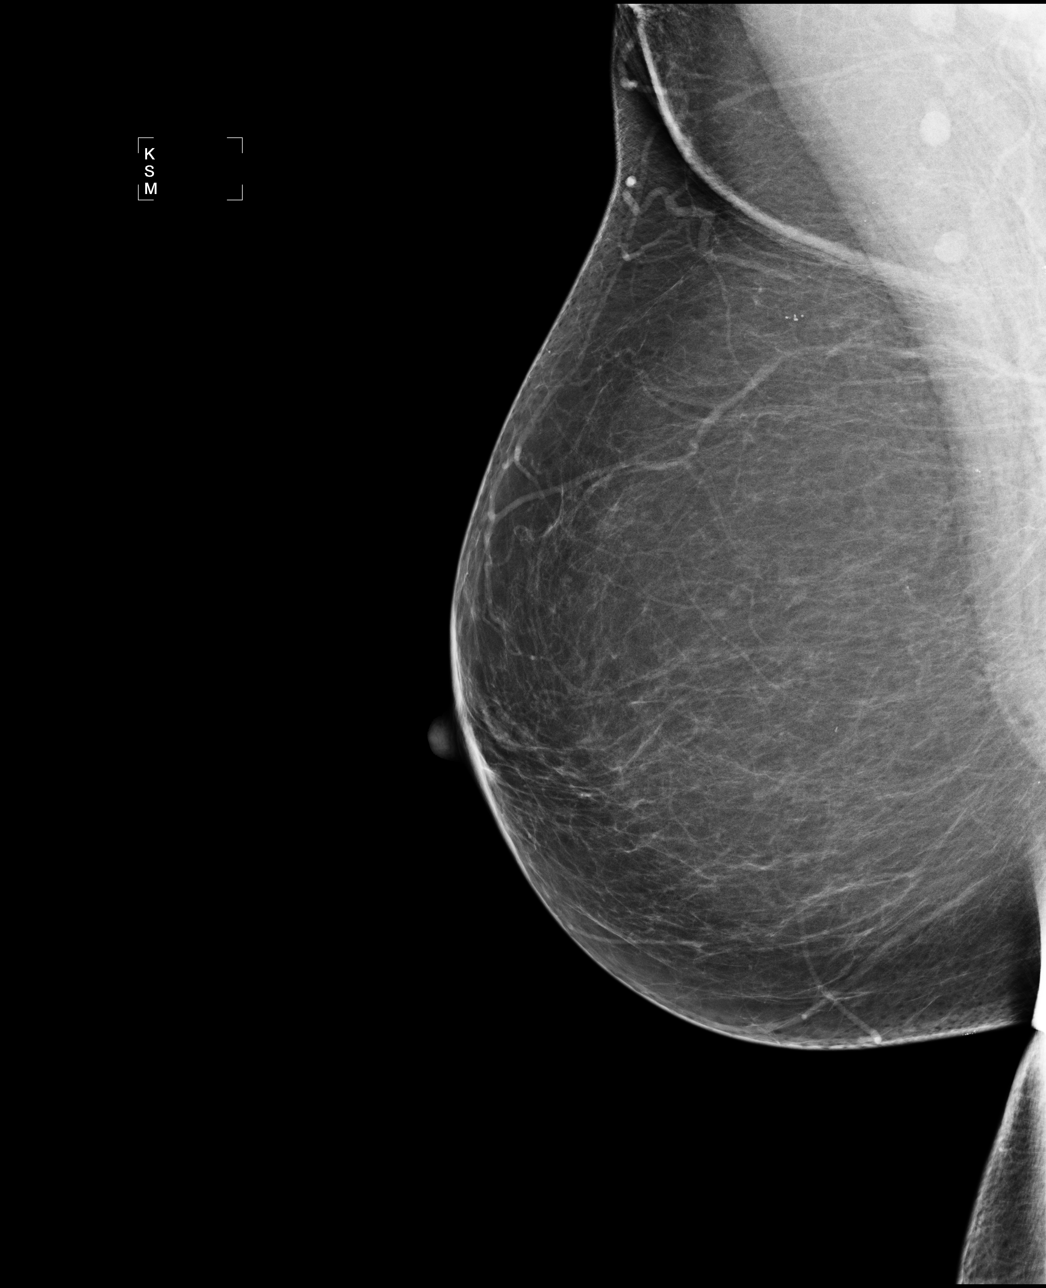

[4 of 4 positions shown; findings below may reference images not displayed]

FINDINGS: The breast tissue is almost entirely fatty. No suspicious
masses, architectural distortion, or calcifications are present.

Images were processed with CAD.
IMPRESSION: No mammographic evidence of malignancy.

A result letter of this screening mammogram will be mailed directly
to the patient.

RECOMMENDATION:
Screening mammogram in one year. (Code:[id number]-[id number])

BI-RADS CATEGORY 1:  Negative.

## 2013-04-26 ENCOUNTER — Telehealth: Payer: Self-pay | Admitting: Family Medicine

## 2013-04-26 MED ORDER — GLUCOSE BLOOD VI STRP: ORAL_STRIP | Status: AC

## 2013-04-26 NOTE — Telephone Encounter (Signed)
New Rx sent.

## 2013-04-26 NOTE — Telephone Encounter (Signed)
Pt needs new rx test strips  for one touch verio iq call into [facility].

## 2013-05-19 ENCOUNTER — Encounter: Payer: Self-pay | Admitting: *Deleted

## 2013-05-19 DIAGNOSIS — R739 Hyperglycemia, unspecified: Secondary | ICD-10-CM | POA: Insufficient documentation

## 2013-05-24 ENCOUNTER — Ambulatory Visit (HOSPITAL_COMMUNITY)
Admission: RE | Admit: 2013-05-24 | Discharge: 2013-05-24 | Disposition: A | Discharge status: Home / Self Care | Payer: Medicare Other | Source: Ambulatory Visit | Attending: Family | Admitting: Family

## 2013-05-24 DIAGNOSIS — Z1231 Encounter for screening mammogram for malignant neoplasm of breast: Secondary | ICD-10-CM | POA: Insufficient documentation

## 2013-05-24 DIAGNOSIS — Z780 Asymptomatic menopausal state: Secondary | ICD-10-CM

## 2013-06-28 DIAGNOSIS — H2510 Age-related nuclear cataract, unspecified eye: Secondary | ICD-10-CM | POA: Diagnosis not present

## 2013-06-28 DIAGNOSIS — H35039 Hypertensive retinopathy, unspecified eye: Secondary | ICD-10-CM | POA: Diagnosis not present

## 2013-07-26 DIAGNOSIS — M545 Low back pain, unspecified: Secondary | ICD-10-CM | POA: Diagnosis not present

## 2013-07-26 DIAGNOSIS — S90129A Contusion of unspecified lesser toe(s) without damage to nail, initial encounter: Secondary | ICD-10-CM | POA: Diagnosis not present

## 2013-07-26 DIAGNOSIS — M4310 Spondylolisthesis, site unspecified: Secondary | ICD-10-CM | POA: Diagnosis not present

## 2013-08-29 ENCOUNTER — Other Ambulatory Visit: Payer: Self-pay | Admitting: *Deleted

## 2013-08-29 ENCOUNTER — Telehealth: Payer: Self-pay

## 2013-08-29 DIAGNOSIS — N952 Postmenopausal atrophic vaginitis: Secondary | ICD-10-CM

## 2013-08-29 DIAGNOSIS — Z23 Encounter for immunization: Secondary | ICD-10-CM

## 2013-08-29 DIAGNOSIS — N760 Acute vaginitis: Secondary | ICD-10-CM

## 2013-08-29 DIAGNOSIS — I10 Essential (primary) hypertension: Secondary | ICD-10-CM

## 2013-08-29 DIAGNOSIS — K219 Gastro-esophageal reflux disease without esophagitis: Secondary | ICD-10-CM

## 2013-08-29 MED ORDER — ATENOLOL-CHLORTHALIDONE 50-25 MG PO TABS: ORAL_TABLET | ORAL | Status: DC

## 2013-08-29 MED ORDER — ZOSTER VACCINE LIVE 19400 UNT/0.65ML ~~LOC~~ SOLR: 0.6500 mL | Freq: Once | SUBCUTANEOUS | Status: DC

## 2013-08-29 NOTE — Telephone Encounter (Signed)
Michelle Spears called [employer] and they told her if she had a prescription for the shingle shot she could get it at the drug store and it would be covered by her insurance. She uses the [facility] at [address]

## 2013-08-29 NOTE — Telephone Encounter (Signed)
Rx sent to pharmacy   

## 2013-09-18 ENCOUNTER — Ambulatory Visit (INDEPENDENT_AMBULATORY_CARE_PROVIDER_SITE_OTHER): Payer: Medicare Other | Admitting: Gynecology

## 2013-09-18 ENCOUNTER — Encounter: Payer: Self-pay | Admitting: Gynecology

## 2013-09-18 VITALS — BP 128/82 | Ht 63.0 in | Wt 166.0 lb

## 2013-09-18 DIAGNOSIS — N951 Menopausal and female climacteric states: Secondary | ICD-10-CM | POA: Diagnosis not present

## 2013-09-18 DIAGNOSIS — L293 Anogenital pruritus, unspecified: Secondary | ICD-10-CM

## 2013-09-18 DIAGNOSIS — Z780 Asymptomatic menopausal state: Secondary | ICD-10-CM

## 2013-09-18 DIAGNOSIS — N952 Postmenopausal atrophic vaginitis: Secondary | ICD-10-CM

## 2013-09-18 DIAGNOSIS — L292 Pruritus vulvae: Secondary | ICD-10-CM

## 2013-09-18 MED ORDER — NONFORMULARY OR COMPOUNDED ITEM: Status: DC

## 2013-09-18 NOTE — Patient Instructions (Addendum)
Hormone Therapy At menopause, your body begins making less estrogen and progesterone hormones. This causes the body to stop having menstrual periods. This is because estrogen and progesterone hormones control your periods and menstrual cycle. A lack of estrogen may cause symptoms such as:  Hot flushes (or hot flashes).  Vaginal dryness.  Dry skin.  Loss of sex drive.  Risk of bone loss (osteoporosis). When this happens, you may choose to take hormone therapy to get back the estrogen lost during menopause. When the hormone estrogen is given alone, it is usually referred to as ET (Estrogen Therapy). When the hormone progestin is combined with estrogen, it is generally called HT (Hormone Therapy). This was formerly known as hormone replacement therapy (HRT). Your caregiver can help you make a decision on what will be best for you. The decision to use HT seems to change often as new studies are done. Many studies do not agree on the benefits of hormone replacement therapy. LIKELY BENEFITS OF HT INCLUDE PROTECTION FROM:  Hot Flushes (also called hot flashes) - A hot flush is a sudden feeling of heat that spreads over the face and body. The skin may redden like a blush. It is connected with sweats and sleep disturbance. Women going through menopause may have hot flushes a few times a month or several times per day depending on the woman.  Osteoporosis (bone loss)- Estrogen helps guard against bone loss. After menopause, a woman's bones slowly lose calcium and become weak and brittle. As a result, bones are more likely to break. The hip, wrist, and spine are affected most often. Hormone therapy can help slow bone loss after menopause. Weight bearing exercise and taking calcium with vitamin D also can help prevent bone loss. There are also medications that your caregiver can prescribe that can help prevent osteoporosis.  Vaginal Dryness - Loss of estrogen causes changes in the vagina. Its lining may  become thin and dry. These changes can cause pain and bleeding during sexual intercourse. Dryness can also lead to infections. This can cause burning and itching. (Vaginal estrogen treatment can help relieve pain, itching, and dryness.)  Urinary Tract Infections are more common after menopause because of lack of estrogen. Some women also develop urinary incontinence because of low estrogen levels in the vagina and bladder.  Possible other benefits of estrogen include a positive effect on mood and short-term memory in women. RISKS AND COMPLICATIONS  Using estrogen alone without progesterone causes the lining of the uterus to grow. This increases the risk of lining of the uterus (endometrial) cancer. Your caregiver should give another hormone called progestin if you have a uterus.  Women who take combined (estrogen and progestin) HT appear to have an increased risk of breast cancer. The risk appears to be small, but increases throughout the time that HT is taken.  Combined therapy also makes the breast tissue slightly denser which makes it harder to read mammograms (breast X-rays).  Combined, estrogen and progesterone therapy can be taken together every day, in which case there may be spotting of blood. HT therapy can be taken cyclically in which case you will have menstrual periods. Cyclically means HT is taken for a set amount of days, then not taken, then this process is repeated.  HT may increase the risk of stroke, heart attack, breast cancer and forming blood clots in your leg.  Transdermal estrogen (estrogen that is absorbed through the skin with a patch or a cream) may have more positive results with:    Cholesterol.  Blood pressure.  Blood clots. Having the following conditions may indicate you should not have HT:  Endometrial cancer.  Liver disease.  Breast cancer.  Heart disease.  History of blood clots.  Stroke. TREATMENT   If you choose to take HT and have a uterus,  usually estrogen and progestin are prescribed.  Your caregiver will help you decide the best way to take the medications.  Possible ways to take estrogen include:  Pills.  Patches.  Gels.  Sprays.  Vaginal estrogen cream, rings and tablets.  It is best to take the lowest dose possible that will help your symptoms and take them for the shortest period of time that you can.  Hormone therapy can help relieve some of the problems (symptoms) that affect women at menopause. Before making a decision about HT, talk to your caregiver about what is best for you. Be well informed and comfortable with your decisions. HOME CARE INSTRUCTIONS   Follow your caregivers advice when taking the medications.  A Pap test is done to screen for cervical cancer.  The first Pap test should be done at age 15.  Between ages 65 and 68, Pap tests are repeated every 2 years.  Beginning at age 27, you are advised to have a Pap test every 3 years as long as your past 3 Pap tests have been normal.  Some women have medical problems that increase the chance of getting cervical cancer. Talk to your caregiver about these problems. It is especially important to talk to your caregiver if a new problem develops soon after your last Pap test. In these cases, your caregiver may recommend more frequent screening and Pap tests.  The above recommendations are the same for women who have or have not gotten the vaccine for HPV (Human Papillomavirus).  If you had a hysterectomy for a problem that was not a cancer or a condition that could lead to cancer, then you no longer need Pap tests. However, even if you no longer need a Pap test, a regular exam is a good idea to make sure no other problems are starting.   If you are between ages 58 and 56, and you have had normal Pap tests going back 10 years, you no longer need Pap tests. However, even if you no longer need a Pap test, a regular exam is a good idea to make sure no  other problems are starting.   If you have had past treatment for cervical cancer or a condition that could lead to cancer, you need Pap tests and screening for cancer for at least 20 years after your treatment.  If Pap tests have been discontinued, risk factors (such as a new sexual partner) need to be re-assessed to determine if screening should be resumed.  Some women may need screenings more often if they are at high risk for cervical cancer.  Get mammograms done as per the advice of your caregiver. SEEK IMMEDIATE MEDICAL CARE IF:  You develop abnormal vaginal bleeding.  You have pain or swelling in your legs, shortness of breath, or chest pain.  You develop dizziness or headaches.  You have lumps or changes in your breasts or armpits.  You have slurred speech.  You develop weakness or numbness of your arms or legs.  You have pain, burning, or bleeding when urinating.  You develop abdominal pain. Document Released: 04/04/2003 Document Revised: 09/28/2011 Document Reviewed: 07/23/2010 [facility] Patient Information 2014 [place], [place]. Menopause Menopause is the normal time  of life when menstrual periods stop completely. Menopause is complete when you have missed 12 consecutive menstrual periods. It usually occurs between the ages of 43 years and 80 years. Very rarely does a woman develop menopause before the age of 39 years. At menopause, your ovaries stop producing the female hormones estrogen and progesterone. This can cause undesirable symptoms and also affect your health. Sometimes the symptoms may occur 4 5 years before the menopause begins. There is no relationship between menopause and:  Oral contraceptives.  Number of children you had.  Race.  The age your menstrual periods started (menarche). Heavy smokers and very thin women may develop menopause earlier in life. CAUSES  The ovaries stop producing the female hormones estrogen and progesterone.  Other causes  include:  Surgery to remove both ovaries.  The ovaries stop functioning for no known reason.  Tumors of the pituitary gland in the brain.  Medical disease that affects the ovaries and hormone production.  Radiation treatment to the abdomen or pelvis.  Chemotherapy that affects the ovaries. SYMPTOMS   Hot flashes.  Night sweats.  Decrease in sex drive.  Vaginal dryness and thinning of the vagina causing painful intercourse.  Dryness of the skin and developing wrinkles.  Headaches.  Tiredness.  Irritability.  Memory problems.  Weight gain.  Bladder infections.  Hair growth of the face and chest.  Infertility. More serious symptoms include:  Loss of bone (osteoporosis) causing breaks (fractures).  Depression.  Hardening and narrowing of the arteries (atherosclerosis) causing heart attacks and strokes. DIAGNOSIS   When the menstrual periods have stopped for 12 straight months.  Physical exam.  Hormone studies of the blood. TREATMENT  There are many treatment choices and nearly as many questions about them. The decisions to treat or not to treat menopausal changes is an individual choice made with your health care provider. Your health care provider can discuss the treatments with you. Together, you can decide which treatment will work best for you. Your treatment choices may include:   Hormone therapy (estrogen and progesterone).  Non-hormonal medicines.  Treating the individual symptoms with medicine (for example antidepressants for depression).  Herbal medicines that may help specific symptoms.  Counseling by a psychiatrist or psychologist.  Group therapy.  Lifestyle changes including:  Eating healthy.  Regular exercise.  Limiting caffeine and alcohol.  Stress management and meditation.  No treatment. HOME CARE INSTRUCTIONS   Take the medicine your health care provider gives you as directed.  Get plenty of sleep and rest.  Exercise  regularly.  Eat a diet that contains calcium (good for the bones) and soy products (acts like estrogen hormone).  Avoid alcoholic beverages.  Do not smoke.  If you have hot flashes, dress in layers.  Take supplements, calcium, and vitamin D to strengthen bones.  You can use over-the-counter lubricants or moisturizers for vaginal dryness.  Group therapy is sometimes very helpful.  Acupuncture may be helpful in some cases. SEEK MEDICAL CARE IF:   You are not sure you are in menopause.  You are having menopausal symptoms and need advice and treatment.  You are still having menstrual periods after age 35 years.  You have pain with intercourse.  Menopause is complete (no menstrual period for 12 months) and you develop vaginal bleeding.  You need a referral to a specialist (gynecologist, psychiatrist, or psychologist) for treatment. SEEK IMMEDIATE MEDICAL CARE IF:   You have severe depression.  You have excessive vaginal bleeding.  You fell and think  you have a broken bone.  You have pain when you urinate. You develop leg or chest pain.Bone Densitometry Bone densitometry is a special X-ray that measures your bone density and can be used to help predict your risk of bone fractures. This test is used to determine bone mineral content and density to diagnose osteoporosis. Osteoporosis is the loss of bone that may cause the bone to become weak. Osteoporosis commonly occurs in women entering menopause. However, it may be found in men and in people with other diseases. PREPARATION FOR TEST No preparation necessary. WHO SHOULD BE TESTED? All women older than 59. Postmenopausal women (50 to 33) with risk factors for osteoporosis. People with a previous fracture caused by normal activities. People with a small body frame (less than 127 poundsor a body mass index [BMI] of less than 21). People who have a parent with a hip fracture or history of osteoporosis. People who  smoke. People who have rheumatoid arthritis. Anyone who engages in excessive alcohol use (more than 3 drinks most days). Women who experience early menopause. WHEN SHOULD YOU BE RETESTED? Current guidelines suggest that you should wait at least 2 years before doing a bone density test again if your first test was normal.Recent studies indicated that women with normal bone density may be able to wait a few years before needing to repeat a bone density test. You should discuss this with your caregiver.  NORMAL FINDINGS  Normal: less than standard deviation below normal (greater than -1). Osteopenia: 1 to 2.5 standard deviations below normal (-1 to -2.5). Osteoporosis: greater than 2.5 standard deviations below normal (less than -2.5). Test results are reported as a "T score" and a "Z score."The T score is a number that compares your bone density with the bone density of healthy, young women.The Z score is a number that compares your bone density with the scores of women who are the same age, gender, and race.  Ranges for normal findings may vary among different laboratories and hospitals. You should always check with your doctor after having lab work or other tests done to discuss the meaning of your test results and whether your values are considered within normal limits. MEANING OF TEST  Your caregiver will go over the test results with you and discuss the importance and meaning of your results, as well as treatment options and the need for additional tests if necessary. OBTAINING THE TEST RESULTS It is your responsibility to obtain your test results. Ask the lab or department performing the test when and how you will get your results. Document Released: 07/28/2004 Document Revised: 09/28/2011 Document Reviewed: 08/20/2010 [facility] Patient Information 2014 [place].  You have a fast pounding heart beat (palpitations).  You have severe headaches.  You develop vision problems.  You  feel a lump in your breast.  You have abdominal pain or severe indigestion. Document Released: 09/26/2003 Document Revised: 03/08/2013 Document Reviewed: 02/02/2013 [facility] Patient Information 2014 [place], [place].

## 2013-09-18 NOTE — Progress Notes (Signed)
Michelle Spears [birth year] [id number]   History:    68 y.o.  female in the patient to our practice. Patient has not seen a gynecologist in several years. Her PCP Dr. Tawanna Cooler had done her Pap smear in the past and her blood work. She states her last Pap smear was normal in 2013 and denies any prior history of abnormal Pap smears. Patient many years ago had a transvaginal hysterectomy without BSO for dysmenorrhea and menorrhagia. She stated that the age of 39 she was on HRT for 5 years. She recently self treated herself for suspected yeast infection whereby she had some vulvar pruritus and took Monistat suppository for 3 days and symptoms resolved. Patient has had issues with vaginal dryness and irritation which has contributed to the decrease her sexual activity. Patient states she had a normal colonoscopy in 2013 her mammogram was normal in 2014 as well. She has not had a bone density study yet.  Past medical history,surgical history, family history and social history were all reviewed and documented in the EPIC chart.  Gynecologic History No LMP recorded. Patient has had a hysterectomy. Contraception: status post hysterectomy Last Pap: 2013. Results were: normal Last mammogram: 2014. Results were: normal  Obstetric History OB History  Gravida Para Term Preterm AB SAB TAB Ectopic Multiple Living  2 2        2     # Outcome Date GA Lbr Len/2nd Weight Sex Delivery Anes PTL Lv  2 PAR           1 PAR                ROS: A ROS was performed and pertinent positives and negatives are included in the history.  GENERAL: No fevers or chills. HEENT: No change in vision, no earache, sore throat or sinus congestion. NECK: No pain or stiffness. CARDIOVASCULAR: No chest pain or pressure. No palpitations. PULMONARY: No shortness of breath, cough or wheeze. GASTROINTESTINAL: No abdominal pain, nausea, vomiting or diarrhea, melena or bright red blood per rectum. GENITOURINARY: No urinary frequency, urgency,  hesitancy or dysuria. MUSCULOSKELETAL: No joint or muscle pain, no back pain, no recent trauma. DERMATOLOGIC: No rash, no itching, no lesions. ENDOCRINE: No polyuria, polydipsia, no heat or cold intolerance. No recent change in weight. HEMATOLOGICAL: No anemia or easy bruising or bleeding. NEUROLOGIC: No headache, seizures, numbness, tingling or weakness. PSYCHIATRIC: No depression, no loss of interest in normal activity or change in sleep pattern.     Exam: chaperone present  BP 128/82  Ht 5\' 3"  (1.6 m)  Wt 166 lb (75.297 kg)  BMI 29.41 kg/m2  Body mass index is 29.41 kg/(m^2).  General appearance : Well developed well nourished female. No acute distress HEENT: Neck supple, trachea midline, no carotid bruits, no thyroidmegaly Lungs: Clear to auscultation, no rhonchi or wheezes, or rib retractions  Heart: Regular rate and rhythm, no murmurs or gallops Breast:Examined in sitting and supine position were symmetrical in appearance, no palpable masses or tenderness,  no skin retraction, no nipple inversion, no nipple discharge, no skin discoloration, no axillary or supraclavicular lymphadenopathy Abdomen: no palpable masses or tenderness, no rebound or guarding Extremities: no edema or skin discoloration or tenderness  Pelvic:  Bartholin, Urethra, Skene Glands: Within normal limits             Vagina: No gross lesions or discharge, vaginal atrophy  Cervix: Absent  Uterus  absent,   Adnexa  Without masses or tenderness  Anus and perineum  normal   Rectovaginal  normal sphincter tone without palpated masses or tenderness             Hemoccult PCP provides     Assessment/Plan:  68 y.o. female for GYN exam. Vaginal atrophy was evident. We have discussed the use of vaginal estrogen cream twice a week. We had a lengthy discussion of the women's health initiative study. We discussed the risks benefits and pros and cons of hormone replacement therapy. She will be prescribed estradiol 0.02% to  apply transvaginally twice a week. Pap smear was not done today in accordance to the new guidelines. She will schedule a bone density study here in our office in the next 2 weeks. We discussed importance of calcium and vitamin D and weightbearing exercises to prevent osteoporosis. Also discussed importance of monthly breast exam. Her PCP has been doing her blood work. She was monitored she needs her mammogram this year.  Note: This dictation was prepared with  Dragon/digital dictation along withSmart phrase technology. Any transcriptional errors that result from this process are unintentional.   Ok Edwards MD, 9:40 AM 09/18/2013

## 2013-09-28 ENCOUNTER — Ambulatory Visit (INDEPENDENT_AMBULATORY_CARE_PROVIDER_SITE_OTHER): Payer: Medicare Other | Admitting: Internal Medicine

## 2013-09-28 ENCOUNTER — Encounter: Payer: Self-pay | Admitting: Internal Medicine

## 2013-09-28 VITALS — BP 132/80 | HR 72 | Ht 63.0 in | Wt 162.2 lb

## 2013-09-28 DIAGNOSIS — K219 Gastro-esophageal reflux disease without esophagitis: Secondary | ICD-10-CM | POA: Diagnosis not present

## 2013-09-28 DIAGNOSIS — K7689 Other specified diseases of liver: Secondary | ICD-10-CM | POA: Diagnosis not present

## 2013-09-28 DIAGNOSIS — R195 Other fecal abnormalities: Secondary | ICD-10-CM | POA: Diagnosis not present

## 2013-09-28 MED ORDER — PANTOPRAZOLE SODIUM 40 MG PO TBEC
40.0000 mg | DELAYED_RELEASE_TABLET | Freq: Every day | ORAL | Status: DC
Start: 2013-09-28 — End: 2013-11-17

## 2013-09-28 NOTE — Progress Notes (Signed)
Subjective:    Patient ID: Michelle Spears, female    DOB: 1945-10-06, 68 y.o.   MRN: [id number]  HPI Raises a nice lady known to me from prior colonoscopy. she is complaining of heartburn issues. She has been on omeprazole for about 2 years, it originally helped but now she is having indigestion midday having use acid reducer or antacids. Sometimes she'll have regurgitation symptoms at night. She was having some postprandial epigastric pain but that went away after she stopped using etodolac. She has not had any hematemesis, vomiting, dysphagia or unintentional weight loss. Drinks about one cup of coffee a day maybe one and a half cups of coffee a day. No other caffeine. 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 A few each day controllable. No bleeding.  Her husband thinks her problems are related to stress. She is married to him for about 2 years, and that is going well but they're looking for another house and trying this elderly woman in and she says that she has "a little problem with my son" so there is some family stress there. She did not elaborate further.  Medications, allergies, past medical history, past surgical history, family history and social history are reviewed and updated in the EMR.  Review of Systems As per history of present illness.    Objective:   Physical Exam General:  NAD Eyes:   anicteric Abdomen:  soft and nontender, BS+  Data Reviewed:  CT scans, previous colonoscopy        Assessment & Plan:  GERD Worse - w/o dysphagia, anemia/bleeding, unintentional weight loss Start pantoprazole 40 mg daily RTC 2 months Watch lifestyle changes ? Stress effect  Loose stools ? 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

## 2013-09-28 NOTE — Assessment & Plan Note (Addendum)
Stable 2004-2013 on CT's Not a clinical issue Explained/reassured

## 2013-09-28 NOTE — Assessment & Plan Note (Addendum)
?   IBS Monitor Reduce fiber (says she is on high fiber diet)

## 2013-09-28 NOTE — Patient Instructions (Signed)
We have sent the following medications to your pharmacy for you to pick up at your convenience: Pantoprazole, stop your omeprazole.  Reduce your fiber in your diet.  Today you have been given a handout to read and follow for GERD.   Follow up with [place] in May, if your better you may cancel your appointment.   I appreciate the opportunity to care for you.

## 2013-09-28 NOTE — Assessment & Plan Note (Signed)
Worse - w/o dysphagia, anemia/bleeding, unintentional weight loss Start pantoprazole 40 mg daily RTC 2 months Watch lifestyle changes ? Stress effect

## 2013-10-12 ENCOUNTER — Other Ambulatory Visit: Payer: Self-pay | Admitting: Gynecology

## 2013-10-12 ENCOUNTER — Ambulatory Visit (INDEPENDENT_AMBULATORY_CARE_PROVIDER_SITE_OTHER): Payer: Medicare Other

## 2013-10-12 DIAGNOSIS — M8580 Other specified disorders of bone density and structure, unspecified site: Secondary | ICD-10-CM

## 2013-10-12 DIAGNOSIS — M949 Disorder of cartilage, unspecified: Secondary | ICD-10-CM | POA: Diagnosis not present

## 2013-10-12 DIAGNOSIS — Z780 Asymptomatic menopausal state: Secondary | ICD-10-CM

## 2013-10-12 DIAGNOSIS — M899 Disorder of bone, unspecified: Secondary | ICD-10-CM

## 2013-11-09 ENCOUNTER — Encounter: Payer: Self-pay | Admitting: Gastroenterology

## 2013-11-17 ENCOUNTER — Encounter: Payer: Self-pay | Admitting: Internal Medicine

## 2013-11-17 MED ORDER — PANTOPRAZOLE SODIUM 40 MG PO TBEC: 40.0000 mg | DELAYED_RELEASE_TABLET | Freq: Every day | ORAL | Status: DC

## 2013-11-22 DIAGNOSIS — M25539 Pain in unspecified wrist: Secondary | ICD-10-CM | POA: Diagnosis not present

## 2013-11-27 ENCOUNTER — Ambulatory Visit: Payer: Medicare Other | Admitting: Internal Medicine

## 2013-12-13 ENCOUNTER — Encounter: Payer: Self-pay | Admitting: *Deleted

## 2013-12-13 DIAGNOSIS — R7303 Prediabetes: Secondary | ICD-10-CM | POA: Insufficient documentation

## 2013-12-18 DIAGNOSIS — H43399 Other vitreous opacities, unspecified eye: Secondary | ICD-10-CM | POA: Diagnosis not present

## 2013-12-20 DIAGNOSIS — M25539 Pain in unspecified wrist: Secondary | ICD-10-CM | POA: Diagnosis not present

## 2013-12-21 ENCOUNTER — Other Ambulatory Visit: Payer: Self-pay | Admitting: Internal Medicine

## 2014-01-15 ENCOUNTER — Ambulatory Visit (INDEPENDENT_AMBULATORY_CARE_PROVIDER_SITE_OTHER): Payer: Medicare Other | Admitting: Family Medicine

## 2014-01-15 ENCOUNTER — Encounter: Payer: Self-pay | Admitting: Family Medicine

## 2014-01-15 VITALS — BP 120/80 | Temp 98.7°F | Wt 166.0 lb

## 2014-01-15 DIAGNOSIS — M2669 Other specified disorders of temporomandibular joint: Secondary | ICD-10-CM

## 2014-01-15 DIAGNOSIS — M26629 Arthralgia of temporomandibular joint, unspecified side: Secondary | ICD-10-CM

## 2014-01-15 MED ORDER — TRAMADOL HCL 50 MG PO TABS: 50.0000 mg | ORAL_TABLET | Freq: Three times a day (TID) | ORAL | Status: DC | PRN

## 2014-01-15 NOTE — Progress Notes (Signed)
Subjective:    Patient ID: Michelle Spears, female    DOB: 12-28-1945, 68 y.o.   MRN: [id number]  HPI Michelle Spears is a 68 year old married female nonsmoker who comes in today for evaluation of right jaw pain for 2 days  Historically she had surgery on her left TMJ 18 years ago because of severe dysfunction. She's done well all this time until recently. 2 days ago she began having some discomfort in her right jaw. It sharp and feels like the same pain she had years ago on the left side. No history of trauma   Review of Systems    review of systems negative Objective:   Physical Exam  Well-developed and nourished female no acute distress vital signs stable she is afebrile exhibits good jaw shows some tenderness right TMJ consistent with TMJ syndrome      Assessment & Plan:  TMJ syndrome...........Marland Kitchen Motrin 400 twice a day........Marland Kitchen mouth guard........ tramadol each bedtime when necessary........... dental consult if symptoms persist

## 2014-01-15 NOTE — Progress Notes (Signed)
Pre visit review using our clinic review tool, if applicable. No additional management support is needed unless otherwise documented below in the visit note. 

## 2014-01-15 NOTE — Patient Instructions (Signed)
Soft diet  Mouth guard at bedtime........... omega sports  Motrin 400 mg twice daily with food  Tramadol 50 mg,,,,,,,, one half to one tablet at bedtime for severe pain  If your symptoms persist dental consult

## 2014-02-12 ENCOUNTER — Other Ambulatory Visit: Payer: Self-pay

## 2014-02-12 MED ORDER — PANTOPRAZOLE SODIUM 40 MG PO TBEC: 40.0000 mg | DELAYED_RELEASE_TABLET | Freq: Every day | ORAL | Status: DC

## 2014-04-11 DIAGNOSIS — M25539 Pain in unspecified wrist: Secondary | ICD-10-CM | POA: Diagnosis not present

## 2014-04-16 ENCOUNTER — Encounter: Payer: Self-pay | Admitting: Family Medicine

## 2014-04-16 ENCOUNTER — Ambulatory Visit (INDEPENDENT_AMBULATORY_CARE_PROVIDER_SITE_OTHER): Payer: Medicare Other | Admitting: Family Medicine

## 2014-04-16 VITALS — BP 120/80 | Temp 98.8°F | Ht 64.0 in | Wt 164.0 lb

## 2014-04-16 DIAGNOSIS — I10 Essential (primary) hypertension: Secondary | ICD-10-CM | POA: Diagnosis not present

## 2014-04-16 DIAGNOSIS — K219 Gastro-esophageal reflux disease without esophagitis: Secondary | ICD-10-CM | POA: Diagnosis not present

## 2014-04-16 DIAGNOSIS — N952 Postmenopausal atrophic vaginitis: Secondary | ICD-10-CM

## 2014-04-16 DIAGNOSIS — Z23 Encounter for immunization: Secondary | ICD-10-CM | POA: Diagnosis not present

## 2014-04-16 DIAGNOSIS — N760 Acute vaginitis: Secondary | ICD-10-CM

## 2014-04-16 LAB — BASIC METABOLIC PANEL
BUN: 20 mg/dL (ref 6–23)
CHLORIDE: 102 meq/L (ref 96–112)
CO2: 31 mEq/L (ref 19–32)
CREATININE: 0.7 mg/dL (ref 0.4–1.2)
Calcium: 9.3 mg/dL (ref 8.4–10.5)
GFR: 93.3 mL/min (ref >60.00)
Glucose, Bld: 109 mg/dL — ABNORMAL HIGH (ref 70–99)
Potassium: 4.1 mEq/L (ref 3.5–5.1)
Sodium: 138 mEq/L (ref 135–145)

## 2014-04-16 LAB — POCT URINALYSIS DIPSTICK
BILIRUBIN UA: NEGATIVE
Blood, UA: NEGATIVE
Glucose, UA: NEGATIVE
KETONES UA: NEGATIVE
LEUKOCYTES UA: NEGATIVE
Nitrite, UA: NEGATIVE
Protein, UA: NEGATIVE
Spec Grav, UA: <=1.005
Urobilinogen, UA: 0.2
pH, UA: 7

## 2014-04-16 LAB — CBC WITH DIFFERENTIAL/PLATELET
Basophils Absolute: 0 10*3/uL (ref 0.0–0.1)
Basophils Relative: 0.4 % (ref 0.0–3.0)
EOS PCT: 2.3 % (ref 0.0–5.0)
Eosinophils Absolute: 0.2 10*3/uL (ref 0.0–0.7)
HCT: 42.2 % (ref 36.0–46.0)
Hemoglobin: 14.3 g/dL (ref 12.0–15.0)
LYMPHS PCT: 18.7 % (ref 12.0–46.0)
Lymphs Abs: 1.7 10*3/uL (ref 0.7–4.0)
MCHC: 33.9 g/dL (ref 30.0–36.0)
MCV: 95.9 fl (ref 78.0–100.0)
MONOS PCT: 6.4 % (ref 3.0–12.0)
Monocytes Absolute: 0.6 10*3/uL (ref 0.1–1.0)
NEUTROS PCT: 72.2 % (ref 43.0–77.0)
Neutro Abs: 6.5 10*3/uL (ref 1.4–7.7)
PLATELETS: 225 10*3/uL (ref 150.0–400.0)
RBC: 4.41 Mil/uL (ref 3.87–5.11)
RDW: 13.3 % (ref 11.5–15.5)
WBC: 9 10*3/uL (ref 4.0–10.5)

## 2014-04-16 LAB — HEPATIC FUNCTION PANEL
ALT: 20 U/L (ref 0–35)
AST: 18 U/L (ref 0–37)
Albumin: 4.2 g/dL (ref 3.5–5.2)
Alkaline Phosphatase: 52 U/L (ref 39–117)
Bilirubin, Direct: 0.1 mg/dL (ref 0.0–0.3)
TOTAL PROTEIN: 7 g/dL (ref 6.0–8.3)
Total Bilirubin: 0.6 mg/dL (ref 0.2–1.2)

## 2014-04-16 LAB — LIPID PANEL
Cholesterol: 189 mg/dL (ref 0–200)
HDL: 39.6 mg/dL (ref >39.00)
LDL Cholesterol: 110 mg/dL — ABNORMAL HIGH (ref 0–99)
NonHDL: 149.4
Total CHOL/HDL Ratio: 5
Triglycerides: 196 mg/dL — ABNORMAL HIGH (ref 0.0–149.0)
VLDL: 39.2 mg/dL (ref 0.0–40.0)

## 2014-04-16 LAB — TSH: TSH: 1.36 u[IU]/mL (ref 0.35–4.50)

## 2014-04-16 MED ORDER — PANTOPRAZOLE SODIUM 40 MG PO TBEC: 40.0000 mg | DELAYED_RELEASE_TABLET | Freq: Every day | ORAL | Status: DC

## 2014-04-16 MED ORDER — ATENOLOL-CHLORTHALIDONE 50-25 MG PO TABS: ORAL_TABLET | ORAL | Status: DC

## 2014-04-16 NOTE — Progress Notes (Signed)
Pre visit review using our clinic review tool, if applicable. No additional management support is needed unless otherwise documented below in the visit note. Lab Results  Component Value Date   HGBA1C 5.9 04/10/2013   Lab Results  Component Value Date   LDLCALC 80 04/10/2013   CREATININE 0.6 04/10/2013

## 2014-04-16 NOTE — Progress Notes (Signed)
Subjective:    Patient ID: Michelle Spears, female    DOB: 06/07/1946, 68 y.o.   MRN: [id number]  HPI [name] is a 68 year old female nonsmoker,,,,,,,, remarried her first husband died,,,,,, who comes in today for evaluation of hypertension reflux esophagitis and vaginal dryness  She gets routine eye care, dental care, BSE monthly, and you mammography, colonoscopy last year normal. Med list reviewed it was up-to-date.  She's had some soreness and left upper anterior chest wall she was bending stood up suddenly and pulled muscle in her chest. This is been sore for about a week.  She's also had floaters and they seem to be getting worse. She saw her optometrist. Referred to Dr. Vonna Kotyk  Cognitive function normal she walks daily home health safety reviewed no issues identified, no guns in the house, she does have a health care power of attorney and living well  Vaccinations updated by Fleet Contras   Review of Systems  Constitutional: Negative.   HENT: Negative.   Eyes: Negative.   Respiratory: Negative.   Cardiovascular: Negative.   Gastrointestinal: Negative.   Genitourinary: Negative.   Musculoskeletal: Negative.   Neurological: Negative.   Psychiatric/Behavioral: Negative.        Objective:   Physical Exam  Nursing note and vitals reviewed. Constitutional: She appears well-developed and well-nourished.  HENT:  Head: Normocephalic and atraumatic.  Right Ear: External ear normal.  Left Ear: External ear normal.  Nose: Nose normal.  Mouth/Throat: Oropharynx is clear and moist.  Eyes: EOM are normal. Pupils are equal, round, and reactive to light.  Neck: Normal range of motion. Neck supple. No thyromegaly present.  Cardiovascular: Normal rate, regular rhythm, normal heart sounds and intact distal pulses.  Exam reveals no gallop and no friction rub.   No murmur heard. No carotid aortic bruits peripheral pulses 2+ and symmetrical  Pulmonary/Chest: Effort normal and breath sounds  normal.  Abdominal: Soft. Bowel sounds are normal. She exhibits no distension and no mass. There is no tenderness. There is no rebound.  Genitourinary:  Pelvic and rectal deferred......Marland Kitchen recent colonoscopy normal......Marland Kitchen bowel movements normal.... no bleeding........ uterus removed for nonmalignant reasons years ago....... no GYN complaints except for vaginal dryness which she uses a cream.  Musculoskeletal: Normal range of motion.  Lymphadenopathy:    She has no cervical adenopathy.  Neurological: She is alert. She has normal reflexes. No cranial nerve deficit. She exhibits normal muscle tone. Coordination normal.  Skin: Skin is warm and dry.  Psychiatric: She has a normal mood and affect. Her behavior is normal. Judgment and thought content normal.          Assessment & Plan:  Healthy female  Hypertension at goal continue Tenoretic one half tablet daily  Postmenopausal vaginal dryness continue hormonal cream twice weekly  Reflux esophagitis continue Protonix  Chest wall pain,,,,,,,,, Motrin 400 twice a day with food

## 2014-04-16 NOTE — Patient Instructions (Signed)
Continue current medications  Motrin 400 mg twice daily with food for the chest wall pain  Return in one year sooner if any problems

## 2014-05-16 ENCOUNTER — Other Ambulatory Visit: Payer: Self-pay | Admitting: Family Medicine

## 2014-05-16 DIAGNOSIS — Z1231 Encounter for screening mammogram for malignant neoplasm of breast: Secondary | ICD-10-CM

## 2014-05-21 ENCOUNTER — Encounter: Payer: Self-pay | Admitting: Family Medicine

## 2014-05-29 ENCOUNTER — Ambulatory Visit (HOSPITAL_COMMUNITY)
Admission: RE | Admit: 2014-05-29 | Discharge: 2014-05-29 | Disposition: A | Discharge status: Home / Self Care | Payer: Medicare Other | Source: Ambulatory Visit | Attending: Family Medicine | Admitting: Family Medicine

## 2014-05-29 DIAGNOSIS — Z1231 Encounter for screening mammogram for malignant neoplasm of breast: Secondary | ICD-10-CM | POA: Diagnosis not present

## 2014-07-02 DIAGNOSIS — H2513 Age-related nuclear cataract, bilateral: Secondary | ICD-10-CM | POA: Diagnosis not present

## 2014-07-02 DIAGNOSIS — H35033 Hypertensive retinopathy, bilateral: Secondary | ICD-10-CM | POA: Diagnosis not present

## 2014-08-01 DIAGNOSIS — Z87891 Personal history of nicotine dependence: Secondary | ICD-10-CM | POA: Diagnosis not present

## 2014-08-01 DIAGNOSIS — K5730 Diverticulosis of large intestine without perforation or abscess without bleeding: Secondary | ICD-10-CM | POA: Diagnosis not present

## 2014-08-01 DIAGNOSIS — K5792 Diverticulitis of intestine, part unspecified, without perforation or abscess without bleeding: Secondary | ICD-10-CM | POA: Diagnosis not present

## 2014-08-01 DIAGNOSIS — I10 Essential (primary) hypertension: Secondary | ICD-10-CM | POA: Diagnosis not present

## 2014-08-01 DIAGNOSIS — R109 Unspecified abdominal pain: Secondary | ICD-10-CM | POA: Diagnosis not present

## 2014-09-04 DIAGNOSIS — H43393 Other vitreous opacities, bilateral: Secondary | ICD-10-CM | POA: Diagnosis not present

## 2014-09-04 DIAGNOSIS — H35033 Hypertensive retinopathy, bilateral: Secondary | ICD-10-CM | POA: Diagnosis not present

## 2014-09-11 DIAGNOSIS — M25531 Pain in right wrist: Secondary | ICD-10-CM | POA: Diagnosis not present

## 2014-09-19 DIAGNOSIS — M19031 Primary osteoarthritis, right wrist: Secondary | ICD-10-CM | POA: Diagnosis not present

## 2014-09-20 ENCOUNTER — Encounter: Payer: Self-pay | Admitting: Gynecology

## 2014-09-20 ENCOUNTER — Ambulatory Visit (INDEPENDENT_AMBULATORY_CARE_PROVIDER_SITE_OTHER): Payer: Medicare Other | Admitting: Gynecology

## 2014-09-20 VITALS — BP 120/76 | Ht 63.0 in | Wt 160.0 lb

## 2014-09-20 DIAGNOSIS — M8580 Other specified disorders of bone density and structure, unspecified site: Secondary | ICD-10-CM

## 2014-09-20 DIAGNOSIS — N952 Postmenopausal atrophic vaginitis: Secondary | ICD-10-CM

## 2014-09-20 DIAGNOSIS — Z01419 Encounter for gynecological examination (general) (routine) without abnormal findings: Secondary | ICD-10-CM

## 2014-09-20 DIAGNOSIS — IMO0002 Reserved for concepts with insufficient information to code with codable children: Secondary | ICD-10-CM | POA: Insufficient documentation

## 2014-09-20 DIAGNOSIS — N8110 Cystocele, unspecified: Secondary | ICD-10-CM | POA: Diagnosis not present

## 2014-09-20 NOTE — Progress Notes (Signed)
Michelle Spears Sep 29, 1945 [id number]   History:    69 y.o.  for annual gyn exam with no complaints today. Her primary care physician has been Dr. Tawanna Cooler who is been monitoring her sugars. Patient states that she has been checking her fasting blood sugars every morning and they've been ranging in the 140 range. She will make arrangements to see him in the next few days for possible treatment of underlying diabetes.She states her last Pap smear was normal in 2013 and denies any prior history of abnormal Pap smears. Patient many years ago had a transvaginal hysterectomy without BSO for dysmenorrhea and menorrhagia. She stated that the age of 82 she was on HRT for 5 years. Patient early this year was treated for diverticulitis and as a result of the antibiotics subsequently developed a yeast infection which she self treated with Monistat. Patient also has had issues with vaginal atrophy and vaginal estrogen had been prescribed last year to apply twice a week and she applies it when necessary. Her bone density study was done last year here in the office and the lowest T score was at the [facility] with a value of -2.1. She had a normal Frax analysis.  Past medical history,surgical history, family history and social history were all reviewed and documented in the EPIC chart.  Gynecologic History No LMP recorded. Patient has had a hysterectomy. Contraception: status post hysterectomy Last Pap: 2013. Results were: normal Last mammogram: 2015. Results were: normal  Obstetric History OB History  Gravida Para Term Preterm AB SAB TAB Ectopic Multiple Living  2 2        2     # Outcome Date GA Lbr Len/2nd Weight Sex Delivery Anes PTL Lv  2 Para           1 Para                ROS: A ROS was performed and pertinent positives and negatives are included in the history.  GENERAL: No fevers or chills. HEENT: No change in vision, no earache, sore throat or sinus congestion. NECK: No pain or stiffness.  CARDIOVASCULAR: No chest pain or pressure. No palpitations. PULMONARY: No shortness of breath, cough or wheeze. GASTROINTESTINAL: No abdominal pain, nausea, vomiting or diarrhea, melena or bright red blood per rectum. GENITOURINARY: No urinary frequency, urgency, hesitancy or dysuria. MUSCULOSKELETAL: No joint or muscle pain, no back pain, no recent trauma. DERMATOLOGIC: No rash, no itching, no lesions. ENDOCRINE: No polyuria, polydipsia, no heat or cold intolerance. No recent change in weight. HEMATOLOGICAL: No anemia or easy bruising or bleeding. NEUROLOGIC: No headache, seizures, numbness, tingling or weakness. PSYCHIATRIC: No depression, no loss of interest in normal activity or change in sleep pattern.     Exam: chaperone present  BP 120/76 mmHg  Ht 5\' 3"  (1.6 m)  Wt 160 lb (72.576 kg)  BMI 28.35 kg/m2  Body mass index is 28.35 kg/(m^2).  General appearance : Well developed well nourished female. No acute distress HEENT: Eyes: no retinal hemorrhage or exudates,  Neck supple, trachea midline, no carotid bruits, no thyroidmegaly Lungs: Clear to auscultation, no rhonchi or wheezes, or rib retractions  Heart: Regular rate and rhythm, no murmurs or gallops Breast:Examined in sitting and supine position were symmetrical in appearance, no palpable masses or tenderness,  no skin retraction, no nipple inversion, no nipple discharge, no skin discoloration, no axillary or supraclavicular lymphadenopathy Abdomen: no palpable masses or tenderness, no rebound or guarding Extremities: no edema or  skin discoloration or tenderness  Pelvic:  Bartholin, Urethra, Skene Glands: Within normal limits             Vagina: No gross lesions or discharge, atrophic changes, first-degree cystocele  Cervix: Absent  Uterus  absent  Adnexa  Without masses or tenderness  Anus and perineum  normal   Rectovaginal  normal sphincter tone without palpated masses or tenderness             Hemoccult PCP will provide      Assessment/Plan:  69 y.o. female for annual exam who was instructed to use the vaginal estrogen either once a week or twice a week to help with her vaginal atrophy. She is currently not sexually active because her husband has prostate cancer. She was found to have a first-degree cystocele. Patient denies any stress urinary incontinence. Pap smear no longer needed according to the new guidelines. We discussed importance of calcium vitamin D and regular exercise for osteoporosis prevention. Patient still has not had her shingles vaccine because of insurance coverage. Her flu vaccine is up-to-date.    Ok Edwards MD, 12:14 PM 09/20/2014

## 2014-10-03 DIAGNOSIS — M25531 Pain in right wrist: Secondary | ICD-10-CM | POA: Diagnosis not present

## 2014-10-08 DIAGNOSIS — M67931 Unspecified disorder of synovium and tendon, right forearm: Secondary | ICD-10-CM | POA: Diagnosis not present

## 2014-10-08 DIAGNOSIS — M25531 Pain in right wrist: Secondary | ICD-10-CM | POA: Diagnosis not present

## 2014-10-11 DIAGNOSIS — M67931 Unspecified disorder of synovium and tendon, right forearm: Secondary | ICD-10-CM | POA: Diagnosis not present

## 2014-10-11 DIAGNOSIS — M25531 Pain in right wrist: Secondary | ICD-10-CM | POA: Diagnosis not present

## 2014-10-17 DIAGNOSIS — M67931 Unspecified disorder of synovium and tendon, right forearm: Secondary | ICD-10-CM | POA: Diagnosis not present

## 2014-10-17 DIAGNOSIS — M25531 Pain in right wrist: Secondary | ICD-10-CM | POA: Diagnosis not present

## 2014-10-18 DIAGNOSIS — E781 Pure hyperglyceridemia: Secondary | ICD-10-CM | POA: Diagnosis not present

## 2014-10-18 DIAGNOSIS — R7301 Impaired fasting glucose: Secondary | ICD-10-CM | POA: Diagnosis not present

## 2014-10-18 DIAGNOSIS — I10 Essential (primary) hypertension: Secondary | ICD-10-CM | POA: Diagnosis not present

## 2014-10-18 DIAGNOSIS — Z8249 Family history of ischemic heart disease and other diseases of the circulatory system: Secondary | ICD-10-CM | POA: Diagnosis not present

## 2014-10-23 DIAGNOSIS — M25531 Pain in right wrist: Secondary | ICD-10-CM | POA: Diagnosis not present

## 2014-10-23 DIAGNOSIS — M67931 Unspecified disorder of synovium and tendon, right forearm: Secondary | ICD-10-CM | POA: Diagnosis not present

## 2014-10-25 DIAGNOSIS — M67931 Unspecified disorder of synovium and tendon, right forearm: Secondary | ICD-10-CM | POA: Diagnosis not present

## 2014-10-25 DIAGNOSIS — M25531 Pain in right wrist: Secondary | ICD-10-CM | POA: Diagnosis not present

## 2014-10-26 ENCOUNTER — Ambulatory Visit: Payer: Medicare Other | Admitting: Cardiology

## 2014-10-29 DIAGNOSIS — M67931 Unspecified disorder of synovium and tendon, right forearm: Secondary | ICD-10-CM | POA: Diagnosis not present

## 2014-10-29 DIAGNOSIS — M25531 Pain in right wrist: Secondary | ICD-10-CM | POA: Diagnosis not present

## 2014-10-31 DIAGNOSIS — M67931 Unspecified disorder of synovium and tendon, right forearm: Secondary | ICD-10-CM | POA: Diagnosis not present

## 2014-10-31 DIAGNOSIS — M25531 Pain in right wrist: Secondary | ICD-10-CM | POA: Diagnosis not present

## 2014-11-05 DIAGNOSIS — M25531 Pain in right wrist: Secondary | ICD-10-CM | POA: Diagnosis not present

## 2014-11-05 DIAGNOSIS — M67931 Unspecified disorder of synovium and tendon, right forearm: Secondary | ICD-10-CM | POA: Diagnosis not present

## 2014-11-07 DIAGNOSIS — M25531 Pain in right wrist: Secondary | ICD-10-CM | POA: Diagnosis not present

## 2014-11-08 DIAGNOSIS — I498 Other specified cardiac arrhythmias: Secondary | ICD-10-CM | POA: Diagnosis not present

## 2014-11-08 DIAGNOSIS — Z8249 Family history of ischemic heart disease and other diseases of the circulatory system: Secondary | ICD-10-CM | POA: Diagnosis not present

## 2014-12-19 HISTORY — PX: GALLBLADDER SURGERY: SHX652

## 2014-12-24 ENCOUNTER — Ambulatory Visit (INDEPENDENT_AMBULATORY_CARE_PROVIDER_SITE_OTHER): Payer: Medicare Other | Admitting: Internal Medicine

## 2014-12-24 VITALS — BP 100/70 | HR 60 | Temp 97.6°F | Resp 17 | Ht 63.0 in | Wt 159.8 lb

## 2014-12-24 DIAGNOSIS — D72829 Elevated white blood cell count, unspecified: Secondary | ICD-10-CM | POA: Diagnosis not present

## 2014-12-24 DIAGNOSIS — R1013 Epigastric pain: Secondary | ICD-10-CM

## 2014-12-24 DIAGNOSIS — R079 Chest pain, unspecified: Secondary | ICD-10-CM | POA: Diagnosis not present

## 2014-12-24 DIAGNOSIS — K811 Chronic cholecystitis: Secondary | ICD-10-CM | POA: Diagnosis not present

## 2014-12-24 DIAGNOSIS — R109 Unspecified abdominal pain: Secondary | ICD-10-CM | POA: Diagnosis not present

## 2014-12-24 DIAGNOSIS — R51 Headache: Secondary | ICD-10-CM | POA: Diagnosis not present

## 2014-12-24 DIAGNOSIS — Z87891 Personal history of nicotine dependence: Secondary | ICD-10-CM | POA: Diagnosis not present

## 2014-12-24 DIAGNOSIS — K8010 Calculus of gallbladder with chronic cholecystitis without obstruction: Secondary | ICD-10-CM | POA: Diagnosis not present

## 2014-12-24 DIAGNOSIS — K8000 Calculus of gallbladder with acute cholecystitis without obstruction: Secondary | ICD-10-CM | POA: Diagnosis not present

## 2014-12-24 DIAGNOSIS — R202 Paresthesia of skin: Secondary | ICD-10-CM

## 2014-12-24 DIAGNOSIS — K760 Fatty (change of) liver, not elsewhere classified: Secondary | ICD-10-CM | POA: Diagnosis not present

## 2014-12-24 DIAGNOSIS — K219 Gastro-esophageal reflux disease without esophagitis: Secondary | ICD-10-CM | POA: Diagnosis not present

## 2014-12-24 DIAGNOSIS — K828 Other specified diseases of gallbladder: Secondary | ICD-10-CM | POA: Diagnosis not present

## 2014-12-24 DIAGNOSIS — K8020 Calculus of gallbladder without cholecystitis without obstruction: Secondary | ICD-10-CM | POA: Diagnosis not present

## 2014-12-24 DIAGNOSIS — I10 Essential (primary) hypertension: Secondary | ICD-10-CM | POA: Diagnosis not present

## 2014-12-24 DIAGNOSIS — K810 Acute cholecystitis: Secondary | ICD-10-CM | POA: Diagnosis not present

## 2014-12-24 LAB — AMYLASE: Amylase: 26 U/L (ref 0–105)

## 2014-12-24 LAB — POCT UA - MICROSCOPIC ONLY
BACTERIA, U MICROSCOPIC: NEGATIVE
CASTS, UR, LPF, POC: NEGATIVE
Crystals, Ur, HPF, POC: NEGATIVE
Mucus, UA: NEGATIVE
RBC, URINE, MICROSCOPIC: NEGATIVE
YEAST UA: NEGATIVE

## 2014-12-24 LAB — COMPREHENSIVE METABOLIC PANEL
ALT: 34 U/L (ref 0–35)
AST: 46 U/L — AB (ref 0–37)
Albumin: 4.3 g/dL (ref 3.5–5.2)
Alkaline Phosphatase: 39 U/L (ref 39–117)
BILIRUBIN TOTAL: 0.7 mg/dL (ref 0.2–1.2)
BUN: 18 mg/dL (ref 6–23)
CHLORIDE: 102 meq/L (ref 96–112)
CO2: 28 mEq/L (ref 19–32)
CREATININE: 0.67 mg/dL (ref 0.50–1.10)
Calcium: 10 mg/dL (ref 8.4–10.5)
GLUCOSE: 154 mg/dL — AB (ref 70–99)
Potassium: 3.7 mEq/L (ref 3.5–5.3)
Sodium: 139 mEq/L (ref 135–145)
Total Protein: 6.5 g/dL (ref 6.0–8.3)

## 2014-12-24 LAB — POCT CBC
Granulocyte percent: 84.7 %G — AB (ref 37–80)
HCT, POC: 40.1 % (ref 37.7–47.9)
HEMOGLOBIN: 13.8 g/dL (ref 12.2–16.2)
LYMPH, POC: 1.2 (ref 0.6–3.4)
MCH, POC: 32 pg — AB (ref 27–31.2)
MCHC: 34.4 g/dL (ref 31.8–35.4)
MCV: 93.1 fL (ref 80–97)
MID (cbc): 0.7 (ref 0–0.9)
MPV: 7.5 fL (ref 0–99.8)
POC Granulocyte: 10.6 — AB (ref 2–6.9)
POC LYMPH %: 10 % (ref 10–50)
POC MID %: 5.3 %M (ref 0–12)
Platelet Count, POC: 284 10*3/uL (ref 142–424)
RBC: 4.31 M/uL (ref 4.04–5.48)
RDW, POC: 12.7 %
WBC: 12.5 10*3/uL — AB (ref 4.6–10.2)

## 2014-12-24 LAB — POCT URINALYSIS DIPSTICK
BILIRUBIN UA: NEGATIVE
GLUCOSE UA: NEGATIVE
Ketones, UA: NEGATIVE
Leukocytes, UA: NEGATIVE
Nitrite, UA: NEGATIVE
PROTEIN UA: NEGATIVE
RBC UA: NEGATIVE
Spec Grav, UA: 1.02
Urobilinogen, UA: 0.2
pH, UA: 7

## 2014-12-24 LAB — LIPASE: Lipase: 16 U/L (ref 0–75)

## 2014-12-24 NOTE — Progress Notes (Signed)
Subjective:    Patient ID: Michelle Spears, female    DOB: September 03, 1945, 69 y.o.   MRN: [id number]  Chief Complaint  Patient presents with  . Abdominal Pain    started this morning   . Back Pain  . Tingling    all over, after shower    Patient Active Problem List   Diagnosis Date Noted  . Osteopenia 09/20/2014  . Cystocele 09/20/2014  . 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 Glucose intolerance (pre-diabetes) 12/13/2013  . Loose stools 09/28/2013  . 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 Hyperglycemia 05/19/2013  . Vaginitis, atrophic 04/07/2012  . HYPERTENSION 03/14/2007  . GERD 03/14/2007  . HEADACHE 03/14/2007   Prior to Admission medications   Medication Sig Start Date End Date Taking? Authorizing Provider  atenolol-chlorthalidone (TENORETIC) 50-25 MG per tablet One half tab every morning 04/16/14  Yes Roderick Pee, MD  CALCIUM PO Take by mouth daily.     Yes Historical Provider, MD  cholecalciferol (VITAMIN D) 1000 UNITS tablet Take 1,000 Units by mouth daily.   Yes Historical Provider, MD  fluticasone (FLONASE) 50 MCG/ACT nasal spray Place into both nostrils daily.   Yes Historical Provider, MD  glucose blood test strip One touch verio, test once daily, Dx 250.00 04/26/13  Yes Roderick Pee, MD  pantoprazole (PROTONIX) 40 MG tablet Take 1 tablet (40 mg total) by mouth daily before breakfast. 04/16/14  Yes Roderick Pee, MD  Cyanocobalamin (VITAMIN B 12) 100 MCG LOZG Take by mouth.    Historical Provider, MD  fenofibrate micronized (LOFIBRA) 134 MG capsule Take 134 mg by mouth daily. 12/18/14   Historical Provider, MD  GLUCOSAMINE SULFATE PO Take 200 mg by mouth 2 (two) times daily.    Historical Provider, MD  NONFORMULARY OR COMPOUNDED ITEM Estradiol .02% 1 ML Prefilled Applicator Sig: apply vaginally twice a week #90 Day Supply with 4 refills Patient not taking: Reported on 12/24/2014 09/18/13   Ok Edwards, MD   Medications, allergies, past medical history, surgical  history, family history, social history and problem list reviewed and updated.  HPI  18 yof with pmh gerd, diverticulitis presents with epigastric pain since this am.   Sx started this am. She awoke and moved a mattress then pulled weeds in garden. Went inside had donut and coffee. Approx one hour later had gradual onset epigastric pain which worsened over course 3-4 hrs. Radiated around both flanks to bilateral back. This all resolved approx one hr ago.   She has hx gerd, takes prilosec qd. Denies recent gerd sx. Denies cp, exertional cp, sob. Denies dysuria, hematuria, vaginal dc. Denies n/v, diarrhea.   Does take ibuprofen daily. Approx 600 mg each morning. Has done this for many yrs. Used to take approx 2000 mg day many yrs ago. Has cut back.   Hx diverticulitis with LLQ pain, this is different.   Also mentions tingling - Feels like body is tingling/itching at diff times of day. Sometimes after shower, sometimes when just relaxing in chair. Benadryl prn helps. Episodes last mins-hrs at at time. Husband states she itches diff areas of her skin a lot.    Review of Systems No fevers, chills. No known sick contacts.     Objective:   Physical Exam  Constitutional: She is oriented to person, place, and time. She appears well-developed and well-nourished.  Non-toxic appearance. She does not have a sickly appearance. She does not appear ill. No distress.  BP 100/70 mmHg  Pulse  60  Temp(Src) 97.6 F (36.4 C) (Oral)  Resp 17  Ht 5\' 3"  (1.6 m)  Wt 159 lb 12.8 oz (72.485 kg)  BMI 28.31 kg/m2  SpO2 98%   Cardiovascular: Normal rate, regular rhythm and normal heart sounds.   Pulmonary/Chest: Effort normal and breath sounds normal. No tachypnea.  Abdominal: Soft. Normal appearance and bowel sounds are normal. She exhibits no abdominal bruit. There is no hepatosplenomegaly. There is no tenderness. There is no rigidity, no rebound, no guarding, no CVA tenderness, no tenderness at McBurney's  point and negative Murphy's sign.  Neurological: She is alert and oriented to person, place, and time.  Psychiatric: She has a normal mood and affect. Her speech is normal and behavior is normal.   Results for orders placed or performed in visit on 12/24/14  POCT urinalysis dipstick  Result Value Ref Range   Color, UA yellow    Clarity, UA clear    Glucose, UA neg    Bilirubin, UA neg    Ketones, UA neg    Spec Grav, UA 1.020    Blood, UA neg    pH, UA 7.0    Protein, UA neg    Urobilinogen, UA 0.2    Nitrite, UA neg    Leukocytes, UA Negative   POCT CBC  Result Value Ref Range   WBC 12.5 (A) 4.6 - 10.2 K/uL   Lymph, poc 1.2 0.6 - 3.4   POC LYMPH PERCENT 10.0 10 - 50 %L   MID (cbc) 0.7 0 - 0.9   POC MID % 5.3 0 - 12 %M   POC Granulocyte 10.6 (A) 2 - 6.9   Granulocyte percent 84.7 (A) 37 - 80 %G   RBC 4.31 4.04 - 5.48 M/uL   Hemoglobin 13.8 12.2 - 16.2 g/dL   HCT, POC 16.1 09.6 - 47.9 %   MCV 93.1 80 - 97 fL   MCH, POC 32.0 (A) 27 - 31.2 pg   MCHC 34.4 31.8 - 35.4 g/dL   RDW, POC 04.5 %   Platelet Count, POC 284 142 - 424 K/uL   MPV 7.5 0 - 99.8 fL   EKG read by Dr Perrin Maltese. Findings: Anterolateral TWI suggestive of ischemia. Slightly extended from previous EKG 9 months ago.      Assessment & Plan:   83 yof with pmh gerd, diverticulitis presents with epigastric pain since this am.   Epigastric abdominal pain - Plan: POCT urinalysis dipstick, POCT UA - Microscopic Only, POCT CBC, Comprehensive metabolic panel, Lipase, Amylase Gastroesophageal reflux disease, esophagitis presence not specified --cmp, lipase, amylase today --leukocytosis on cbc --ekg with new concerning findings for ischemia, to ER emergently by EMS --dc xray order as ekg was abn, defer to ER  Tingling of skin -- moisturizer, antihistamine, topical cs  Donnajean Lopes, PA-C Physician Assistant-Certified [facility]  12/24/2014 2:05 PM

## 2015-02-04 DIAGNOSIS — M722 Plantar fascial fibromatosis: Secondary | ICD-10-CM | POA: Diagnosis not present

## 2015-02-04 DIAGNOSIS — I10 Essential (primary) hypertension: Secondary | ICD-10-CM | POA: Diagnosis not present

## 2015-02-04 DIAGNOSIS — E781 Pure hyperglyceridemia: Secondary | ICD-10-CM | POA: Diagnosis not present

## 2015-02-04 DIAGNOSIS — E782 Mixed hyperlipidemia: Secondary | ICD-10-CM | POA: Diagnosis not present

## 2015-02-04 DIAGNOSIS — M7721 Periarthritis, right wrist: Secondary | ICD-10-CM | POA: Diagnosis not present

## 2015-02-04 DIAGNOSIS — R7301 Impaired fasting glucose: Secondary | ICD-10-CM | POA: Diagnosis not present

## 2015-04-10 DIAGNOSIS — Z23 Encounter for immunization: Secondary | ICD-10-CM | POA: Diagnosis not present

## 2015-04-10 DIAGNOSIS — Z0001 Encounter for general adult medical examination with abnormal findings: Secondary | ICD-10-CM | POA: Diagnosis not present

## 2015-04-10 DIAGNOSIS — Z1231 Encounter for screening mammogram for malignant neoplasm of breast: Secondary | ICD-10-CM | POA: Diagnosis not present

## 2015-04-10 DIAGNOSIS — Z13820 Encounter for screening for osteoporosis: Secondary | ICD-10-CM | POA: Diagnosis not present

## 2015-04-10 DIAGNOSIS — Z1211 Encounter for screening for malignant neoplasm of colon: Secondary | ICD-10-CM | POA: Diagnosis not present

## 2015-05-02 DIAGNOSIS — L299 Pruritus, unspecified: Secondary | ICD-10-CM | POA: Diagnosis not present

## 2015-05-02 DIAGNOSIS — J3089 Other allergic rhinitis: Secondary | ICD-10-CM | POA: Diagnosis not present

## 2015-05-02 DIAGNOSIS — B078 Other viral warts: Secondary | ICD-10-CM | POA: Diagnosis not present

## 2015-05-02 DIAGNOSIS — L298 Other pruritus: Secondary | ICD-10-CM | POA: Diagnosis not present

## 2015-05-09 DIAGNOSIS — I10 Essential (primary) hypertension: Secondary | ICD-10-CM | POA: Diagnosis not present

## 2015-05-09 DIAGNOSIS — M722 Plantar fascial fibromatosis: Secondary | ICD-10-CM | POA: Diagnosis not present

## 2015-05-09 DIAGNOSIS — E7800 Pure hypercholesterolemia, unspecified: Secondary | ICD-10-CM | POA: Diagnosis not present

## 2015-05-09 DIAGNOSIS — L298 Other pruritus: Secondary | ICD-10-CM | POA: Diagnosis not present

## 2015-05-09 DIAGNOSIS — E781 Pure hyperglyceridemia: Secondary | ICD-10-CM | POA: Diagnosis not present

## 2015-05-09 DIAGNOSIS — R7301 Impaired fasting glucose: Secondary | ICD-10-CM | POA: Diagnosis not present

## 2015-07-04 ENCOUNTER — Ambulatory Visit: Payer: Medicare Other | Admitting: Allergy and Immunology

## 2015-07-08 DIAGNOSIS — H35033 Hypertensive retinopathy, bilateral: Secondary | ICD-10-CM | POA: Diagnosis not present

## 2015-07-08 DIAGNOSIS — H524 Presbyopia: Secondary | ICD-10-CM | POA: Diagnosis not present

## 2015-07-21 HISTORY — PX: WRIST SURGERY: SHX841

## 2015-07-30 ENCOUNTER — Other Ambulatory Visit: Payer: Self-pay

## 2015-07-30 DIAGNOSIS — Z1231 Encounter for screening mammogram for malignant neoplasm of breast: Secondary | ICD-10-CM

## 2015-08-01 ENCOUNTER — Encounter: Payer: Self-pay | Admitting: Allergy and Immunology

## 2015-08-01 ENCOUNTER — Ambulatory Visit (INDEPENDENT_AMBULATORY_CARE_PROVIDER_SITE_OTHER): Payer: Medicare Other | Admitting: Allergy and Immunology

## 2015-08-01 VITALS — BP 140/80 | HR 60 | Temp 98.8°F | Resp 16 | Ht 62.5 in | Wt 162.0 lb

## 2015-08-01 DIAGNOSIS — J3089 Other allergic rhinitis: Secondary | ICD-10-CM | POA: Insufficient documentation

## 2015-08-01 DIAGNOSIS — L299 Pruritus, unspecified: Secondary | ICD-10-CM

## 2015-08-01 MED ORDER — CETIRIZINE HCL 10 MG PO TABS: ORAL_TABLET | ORAL | Status: AC

## 2015-08-01 NOTE — Patient Instructions (Addendum)
  1. Allergen avoidance measures?  2. Use cetirizine 10 mg one or 2 times per day to prevent itching  3. Can add Benadryl 25-50 mg every 6 hours as needed for itching  4. Tsh, t4, t.p., anti-mitochondrial ab, sed, ua  5. Call clinic in 2 weeks with update regarding high-dose cetirizine response

## 2015-08-01 NOTE — Progress Notes (Signed)
[place]    NEW PATIENT NOTE  Referring Provider: Rochel Brome, MD Primary Provider: Rochel Brome, MD Date of office visit: 08/01/2015    Subjective:   Chief Complaint:  SOLEY LIEFER is a 70 y.o. female with a chief complaint of New Patient (Initial Visit)  who presents to the clinic with the following problems:  HPI Comments:  Laurna presents to this clinic on 08/01/2015 in evaluation of itchiness. She has this itching stinging sensation of her skin that has been going on for several years but is not associated with any systemic or constitutional symptoms and has no obvious provoking factor. There is no obvious dermatitis accompanying this pruritus. She does excoriate her skin on occasion. She'll develop bouts of 5-60 minutes of intense itching and stinging. There may be an association with exposure to cleaning products and dust exposure and would dust exposure and after she comes out of the shower. All the medications she is currently using have been started after her pruritic condition developed. She has no other associated atopic disease except for occasional sneezing. She's apparently had some blood tests performed at Dr. Alyse Low office in September which were normal.   Past Medical History  Diagnosis Date  . GERD (gastroesophageal reflux disease)   . Headache(784.0)   . Hypertension   . PMS (premenstrual syndrome)   . History of hysterectomy     nonmalignant reasons  . Kidney stone 1975/2004  . Diverticulosis 2007  . Thyroid goiter     hx of  . Spastic colon   . Internal hemorrhoids   . Hepatic cyst     Past Surgical History  Procedure Laterality Date  . Total abdominal hysterectomy w/ bilateral salpingoophorectomy  1985  . Childbirthx2    . Temporomandibular joint surgery  1995  . Kidney stone surgery  1975  . Bladder stent      for stones  . Bladder stent removal    . Tonsillectomy    . Abdominal  hysterectomy    . Colonoscopy  2013  . Gallbladder surgery  12/2014    Current Outpatient Prescriptions on File Prior to Visit  Medication Sig Dispense Refill  . atenolol-chlorthalidone (TENORETIC) 50-25 MG per tablet One half tab every morning 90 tablet 3  . CALCIUM PO Take by mouth daily.      . cholecalciferol (VITAMIN D) 1000 UNITS tablet Take 1,000 Units by mouth daily. Reported on 08/01/2015    . Cyanocobalamin (VITAMIN B 12) 100 MCG LOZG Take by mouth. Reported on 08/01/2015    . fenofibrate micronized (LOFIBRA) 134 MG capsule Take 134 mg by mouth daily.  1  . fluticasone (FLONASE) 50 MCG/ACT nasal spray Place 1 spray into both nostrils daily as needed.     Marland Kitchen GLUCOSAMINE SULFATE PO Take 200 mg by mouth 2 (two) times daily. Reported on 08/01/2015    . glucose blood test strip One touch verio, test once daily, Dx 250.00 (Patient not taking: Reported on 08/01/2015) 100 each 3  . NONFORMULARY OR COMPOUNDED ITEM Estradiol .02% 1 ML Prefilled Applicator Sig: apply vaginally twice a week #90 Day Supply with 4 refills (Patient not taking: Reported on 12/24/2014) 1 each 4  . pantoprazole (PROTONIX) 40 MG tablet Take 1 tablet (40 mg total) by mouth daily before breakfast. 90 tablet 3   No current facility-administered medications on file prior to visit.    No orders of the defined types were placed in this encounter.  No Known Allergies  Review of systems negative except as noted in HPI / PMHx or noted below:  Review of Systems  Constitutional: Negative.   HENT: Negative.   Eyes: Negative.   Respiratory: Negative.   Cardiovascular: Negative.   Gastrointestinal: Negative.   Genitourinary: Negative.   Musculoskeletal: Positive for joint pain (right wrist arthritis).  Skin: Negative.   Neurological: Negative.   Endo/Heme/Allergies: Negative.   Psychiatric/Behavioral: Negative.     Family History  Problem Relation Age of Onset  . Hyperlipidemia Mother   . Obesity Father   .  Heart attack Father   . Hypertension Father   . Hyperlipidemia Other     siblings    Social History   Social History  . Marital Status: Married    Spouse Name: N/A  . Number of Children: 2  . Years of Education: N/A   Occupational History  . retired    Social History Main Topics  . Smoking status: Former Smoker -- 0.30 packs/day for 12 years    Types: Cigarettes    Quit date: 07/20/2004  . Smokeless tobacco: Never Used  . Alcohol Use: 0.0 oz/week    0 Standard drinks or equivalent per week     Comment: occ.   . Drug Use: No  . Sexual Activity: Not on file   Other Topics Concern  . Not on file   Social History Narrative    Environmental and Social history  Lives in a house with a dry environment, a cat Located inside [employer], Geneticist, molecular in the [place], No Plastic on the Bed or Pillow, No Smoking Ongoing with inside the Household   Objective:   Filed Vitals:   08/01/15 0900  BP: 140/80  Pulse: 60  Temp: 98.8 F (37.1 C)  Resp: 16   Height: 5' 2.5" (158.8 cm) Weight: 162 lb 0.6 oz (73.5 kg)  Physical Exam  Constitutional: She is well-developed, well-nourished, and in no distress. No distress.  HENT:  Head: Normocephalic. Head is without right periorbital erythema and without left periorbital erythema.  Right Ear: Tympanic membrane, external ear and ear canal normal.  Left Ear: Tympanic membrane, external ear and ear canal normal.  Nose: Nose normal. No mucosal edema or rhinorrhea.  Mouth/Throat: Oropharynx is clear and moist and mucous membranes are normal. No oropharyngeal exudate.  Eyes: Conjunctivae and lids are normal. Pupils are equal, round, and reactive to light.  Neck: Trachea normal. No tracheal deviation present. No thyromegaly present.  Cardiovascular: Normal rate, regular rhythm, S1 normal, S2 normal and normal heart sounds.   No murmur heard. Pulmonary/Chest: Effort normal. No stridor. No tachypnea. No respiratory distress. She has no  wheezes. She has no rales. She exhibits no tenderness.  Abdominal: Soft. She exhibits no distension and no mass. There is no hepatosplenomegaly. There is no tenderness. There is no rebound and no guarding.  Musculoskeletal: She exhibits no edema or tenderness.  Lymphadenopathy:       Head (right side): No tonsillar adenopathy present.       Head (left side): No tonsillar adenopathy present.    She has no cervical adenopathy.    She has no axillary adenopathy.  Neurological: She is alert. Gait normal.  Skin: No rash noted. She is not diaphoretic. No erythema. No pallor. Nails show no clubbing.  Psychiatric: Mood and affect normal.    Diagnostics:  Allergy skin tests were performed. She did not demonstrate any reactions to a screening panel of foods or aeroallergens  Review  of blood tests forwarded by Dr. Tobie Poet dated 05/09/2015 refers to a white blood cell count of 7.8 with a normal differential, hemoglobin of 13.3 with an MCV of 97, a platelet count of 235, normal hepatic and renal function, negative IgE food screen, negative IgE aeroallergens screen.  Assessment and Plan:    1. Pruritic dermatitis   2. Other allergic rhinitis     Patient Instructions   1. Allergen avoidance measures?  2. Use cetirizine 10 mg one or 2 times per day to prevent itching  3. Can add Benadryl 25-50 mg every 6 hours as needed for itching  4. Tsh, t4, t.p., anti-mitochondrial ab, sed, ua  5. Call clinic in 2 weeks with update regarding high-dose cetirizine response  I will have Charie obtain a few additional blood tests in investigation of her pruritic condition and will make a determination about how to proceed pending her response to increasing doses of cetirizine. Further evaluation treatment will be dependent on her response to therapy and the results of her diagnostic testing.   Allena Katz, MD [place]

## 2015-08-03 LAB — URINALYSIS
Bilirubin, UA: NEGATIVE
Glucose, UA: NEGATIVE
Ketones, UA: NEGATIVE
Nitrite, UA: NEGATIVE
PROTEIN UA: NEGATIVE
RBC, UA: NEGATIVE
Specific Gravity, UA: 1.009 (ref 1.005–1.030)
UUROB: 0.2 mg/dL (ref 0.2–1.0)
pH, UA: 7 (ref 5.0–7.5)

## 2015-08-03 LAB — PROTEIN, TOTAL: TOTAL PROTEIN: 6.5 g/dL (ref 6.0–8.5)

## 2015-08-03 LAB — MITOCHONDRIAL ANTIBODIES: Mitochondrial Ab: 4.6 Units (ref 0.0–20.0)

## 2015-08-03 LAB — T4, FREE: FREE T4: 1.36 ng/dL (ref 0.82–1.77)

## 2015-08-03 LAB — TSH: TSH: 2.11 u[IU]/mL (ref 0.450–4.500)

## 2015-08-03 LAB — SEDIMENTATION RATE: SED RATE: 2 mm/h (ref 0–40)

## 2015-08-15 DIAGNOSIS — E782 Mixed hyperlipidemia: Secondary | ICD-10-CM | POA: Diagnosis not present

## 2015-08-15 DIAGNOSIS — E119 Type 2 diabetes mellitus without complications: Secondary | ICD-10-CM | POA: Diagnosis not present

## 2015-08-15 DIAGNOSIS — I10 Essential (primary) hypertension: Secondary | ICD-10-CM | POA: Diagnosis not present

## 2015-08-15 DIAGNOSIS — R7301 Impaired fasting glucose: Secondary | ICD-10-CM | POA: Diagnosis not present

## 2015-08-15 DIAGNOSIS — M7721 Periarthritis, right wrist: Secondary | ICD-10-CM | POA: Diagnosis not present

## 2015-08-15 DIAGNOSIS — E781 Pure hyperglyceridemia: Secondary | ICD-10-CM | POA: Diagnosis not present

## 2015-08-20 ENCOUNTER — Ambulatory Visit
Admission: RE | Admit: 2015-08-20 | Discharge: 2015-08-20 | Disposition: A | Discharge status: Home / Self Care | Payer: Medicare Other | Source: Ambulatory Visit

## 2015-08-20 DIAGNOSIS — Z1231 Encounter for screening mammogram for malignant neoplasm of breast: Secondary | ICD-10-CM

## 2015-08-22 ENCOUNTER — Telehealth: Payer: Self-pay | Admitting: Allergy and Immunology

## 2015-08-22 NOTE — Telephone Encounter (Signed)
Patient is feeling much better but is asking when she needs to come back to be seen ?

## 2015-08-22 NOTE — Telephone Encounter (Signed)
PT CALLED TO LET YOU KNOW THAT THE ZYRTEC WAS HELPING WITH THE ITCHING SOME. WANTED TO KNOW WHEN TO COME BACK.

## 2015-08-22 NOTE — Telephone Encounter (Signed)
Called and notified patient.

## 2015-08-22 NOTE — Telephone Encounter (Signed)
Please inform patient that if she continues to do well as we passed through the spring season she can come back to see me this summer. If she has problems during that interval she should come in earlier.

## 2015-08-26 DIAGNOSIS — R0789 Other chest pain: Secondary | ICD-10-CM | POA: Diagnosis not present

## 2015-08-26 DIAGNOSIS — R0781 Pleurodynia: Secondary | ICD-10-CM | POA: Diagnosis not present

## 2015-08-27 ENCOUNTER — Telehealth: Payer: Self-pay | Admitting: Allergy and Immunology

## 2015-08-27 NOTE — Telephone Encounter (Signed)
She received a bill yesterday but she wants to make sure that her [employer] plan had paid on the account. She looked her insurance up on line and it showed that it had been paid on January 27 but the bill from [place] was sent out on January 29. Can you check it out and let her know what they have paid please.

## 2015-08-27 NOTE — Telephone Encounter (Signed)
Left message that we will appeal testing that was denied as not medically necessary - not sure why she even got a bill

## 2015-09-27 DIAGNOSIS — N3001 Acute cystitis with hematuria: Secondary | ICD-10-CM | POA: Diagnosis not present

## 2015-10-26 DIAGNOSIS — M778 Other enthesopathies, not elsewhere classified: Secondary | ICD-10-CM | POA: Diagnosis not present

## 2015-11-14 DIAGNOSIS — I10 Essential (primary) hypertension: Secondary | ICD-10-CM | POA: Diagnosis not present

## 2015-11-14 DIAGNOSIS — M7721 Periarthritis, right wrist: Secondary | ICD-10-CM | POA: Diagnosis not present

## 2015-11-14 DIAGNOSIS — E782 Mixed hyperlipidemia: Secondary | ICD-10-CM | POA: Diagnosis not present

## 2015-11-14 DIAGNOSIS — R7301 Impaired fasting glucose: Secondary | ICD-10-CM | POA: Diagnosis not present

## 2015-11-18 ENCOUNTER — Other Ambulatory Visit: Payer: Self-pay | Admitting: Orthopedic Surgery

## 2015-11-18 DIAGNOSIS — M19031 Primary osteoarthritis, right wrist: Secondary | ICD-10-CM

## 2015-11-23 ENCOUNTER — Ambulatory Visit
Admission: RE | Admit: 2015-11-23 | Discharge: 2015-11-23 | Disposition: A | Discharge status: Home / Self Care | Payer: Medicare Other | Source: Ambulatory Visit | Attending: Orthopedic Surgery | Admitting: Orthopedic Surgery

## 2015-11-23 DIAGNOSIS — M19031 Primary osteoarthritis, right wrist: Secondary | ICD-10-CM

## 2015-11-23 DIAGNOSIS — S63014A Dislocation of distal radioulnar joint of right wrist, initial encounter: Secondary | ICD-10-CM | POA: Diagnosis not present

## 2015-12-04 DIAGNOSIS — M19031 Primary osteoarthritis, right wrist: Secondary | ICD-10-CM | POA: Diagnosis not present

## 2016-01-02 DIAGNOSIS — Z01810 Encounter for preprocedural cardiovascular examination: Secondary | ICD-10-CM | POA: Diagnosis not present

## 2016-01-02 DIAGNOSIS — Z01818 Encounter for other preprocedural examination: Secondary | ICD-10-CM | POA: Diagnosis not present

## 2016-01-07 DIAGNOSIS — M19031 Primary osteoarthritis, right wrist: Secondary | ICD-10-CM | POA: Diagnosis not present

## 2016-01-07 DIAGNOSIS — M24131 Other articular cartilage disorders, right wrist: Secondary | ICD-10-CM | POA: Diagnosis not present

## 2016-01-07 DIAGNOSIS — G8918 Other acute postprocedural pain: Secondary | ICD-10-CM | POA: Diagnosis not present

## 2016-01-07 DIAGNOSIS — M67931 Unspecified disorder of synovium and tendon, right forearm: Secondary | ICD-10-CM | POA: Diagnosis not present

## 2016-01-20 DIAGNOSIS — Z4789 Encounter for other orthopedic aftercare: Secondary | ICD-10-CM | POA: Diagnosis not present

## 2016-01-29 DIAGNOSIS — M19031 Primary osteoarthritis, right wrist: Secondary | ICD-10-CM | POA: Diagnosis not present

## 2016-02-10 DIAGNOSIS — Z4789 Encounter for other orthopedic aftercare: Secondary | ICD-10-CM | POA: Diagnosis not present

## 2016-02-12 DIAGNOSIS — M19031 Primary osteoarthritis, right wrist: Secondary | ICD-10-CM | POA: Diagnosis not present

## 2016-02-14 DIAGNOSIS — M7721 Periarthritis, right wrist: Secondary | ICD-10-CM | POA: Diagnosis not present

## 2016-02-14 DIAGNOSIS — R7301 Impaired fasting glucose: Secondary | ICD-10-CM | POA: Diagnosis not present

## 2016-02-14 DIAGNOSIS — E782 Mixed hyperlipidemia: Secondary | ICD-10-CM | POA: Diagnosis not present

## 2016-02-14 DIAGNOSIS — M791 Myalgia: Secondary | ICD-10-CM | POA: Diagnosis not present

## 2016-02-14 DIAGNOSIS — I10 Essential (primary) hypertension: Secondary | ICD-10-CM | POA: Diagnosis not present

## 2016-02-14 DIAGNOSIS — M790 Rheumatism, unspecified: Secondary | ICD-10-CM | POA: Diagnosis not present

## 2016-02-20 DIAGNOSIS — M19031 Primary osteoarthritis, right wrist: Secondary | ICD-10-CM | POA: Diagnosis not present

## 2016-02-27 DIAGNOSIS — M19031 Primary osteoarthritis, right wrist: Secondary | ICD-10-CM | POA: Diagnosis not present

## 2016-03-16 DIAGNOSIS — Z4789 Encounter for other orthopedic aftercare: Secondary | ICD-10-CM | POA: Diagnosis not present

## 2016-04-17 DIAGNOSIS — Z23 Encounter for immunization: Secondary | ICD-10-CM | POA: Diagnosis not present

## 2016-05-25 DIAGNOSIS — M791 Myalgia: Secondary | ICD-10-CM | POA: Diagnosis not present

## 2016-05-25 DIAGNOSIS — R7301 Impaired fasting glucose: Secondary | ICD-10-CM | POA: Diagnosis not present

## 2016-05-25 DIAGNOSIS — E782 Mixed hyperlipidemia: Secondary | ICD-10-CM | POA: Diagnosis not present

## 2016-05-25 DIAGNOSIS — I10 Essential (primary) hypertension: Secondary | ICD-10-CM | POA: Diagnosis not present

## 2016-06-04 DIAGNOSIS — Z0000 Encounter for general adult medical examination without abnormal findings: Secondary | ICD-10-CM | POA: Diagnosis not present

## 2016-06-04 DIAGNOSIS — Z1239 Encounter for other screening for malignant neoplasm of breast: Secondary | ICD-10-CM | POA: Diagnosis not present

## 2016-06-04 DIAGNOSIS — Z6829 Body mass index (BMI) 29.0-29.9, adult: Secondary | ICD-10-CM | POA: Diagnosis not present

## 2016-06-04 DIAGNOSIS — Z13820 Encounter for screening for osteoporosis: Secondary | ICD-10-CM | POA: Diagnosis not present

## 2016-06-04 DIAGNOSIS — E663 Overweight: Secondary | ICD-10-CM | POA: Diagnosis not present

## 2016-06-09 ENCOUNTER — Other Ambulatory Visit: Payer: Self-pay | Admitting: Family Medicine

## 2016-06-09 DIAGNOSIS — Z1231 Encounter for screening mammogram for malignant neoplasm of breast: Secondary | ICD-10-CM

## 2016-06-15 DIAGNOSIS — Z4789 Encounter for other orthopedic aftercare: Secondary | ICD-10-CM | POA: Diagnosis not present

## 2016-06-15 DIAGNOSIS — M19031 Primary osteoarthritis, right wrist: Secondary | ICD-10-CM | POA: Diagnosis not present

## 2016-06-30 DIAGNOSIS — H35033 Hypertensive retinopathy, bilateral: Secondary | ICD-10-CM | POA: Diagnosis not present

## 2016-08-20 ENCOUNTER — Ambulatory Visit
Admission: RE | Admit: 2016-08-20 | Discharge: 2016-08-20 | Disposition: A | Discharge status: Home / Self Care | Payer: Medicare Other | Source: Ambulatory Visit | Attending: Family Medicine | Admitting: Family Medicine

## 2016-08-20 DIAGNOSIS — Z1231 Encounter for screening mammogram for malignant neoplasm of breast: Secondary | ICD-10-CM | POA: Diagnosis not present

## 2016-08-21 ENCOUNTER — Other Ambulatory Visit: Payer: Self-pay | Admitting: Family Medicine

## 2016-08-21 DIAGNOSIS — R928 Other abnormal and inconclusive findings on diagnostic imaging of breast: Secondary | ICD-10-CM

## 2016-08-25 ENCOUNTER — Other Ambulatory Visit: Payer: Self-pay | Admitting: Internal Medicine

## 2016-08-25 ENCOUNTER — Other Ambulatory Visit: Payer: Self-pay | Admitting: Family Medicine

## 2016-08-25 ENCOUNTER — Other Ambulatory Visit: Payer: Self-pay | Admitting: Obstetrics and Gynecology

## 2016-08-25 ENCOUNTER — Other Ambulatory Visit: Payer: Self-pay | Admitting: Hematology and Oncology

## 2016-08-25 DIAGNOSIS — E2839 Other primary ovarian failure: Secondary | ICD-10-CM

## 2016-08-26 ENCOUNTER — Ambulatory Visit
Admission: RE | Admit: 2016-08-26 | Discharge: 2016-08-26 | Disposition: A | Discharge status: Home / Self Care | Payer: Medicare Other | Source: Ambulatory Visit | Attending: Family Medicine | Admitting: Family Medicine

## 2016-08-26 ENCOUNTER — Other Ambulatory Visit: Payer: Self-pay | Admitting: Family Medicine

## 2016-08-26 DIAGNOSIS — N6311 Unspecified lump in the right breast, upper outer quadrant: Secondary | ICD-10-CM | POA: Diagnosis not present

## 2016-08-26 DIAGNOSIS — R928 Other abnormal and inconclusive findings on diagnostic imaging of breast: Secondary | ICD-10-CM

## 2016-08-26 DIAGNOSIS — N6310 Unspecified lump in the right breast, unspecified quadrant: Secondary | ICD-10-CM

## 2016-08-27 DIAGNOSIS — R7301 Impaired fasting glucose: Secondary | ICD-10-CM | POA: Diagnosis not present

## 2016-08-27 DIAGNOSIS — M79641 Pain in right hand: Secondary | ICD-10-CM | POA: Diagnosis not present

## 2016-08-27 DIAGNOSIS — E782 Mixed hyperlipidemia: Secondary | ICD-10-CM | POA: Diagnosis not present

## 2016-08-27 DIAGNOSIS — D4861 Neoplasm of uncertain behavior of right breast: Secondary | ICD-10-CM | POA: Diagnosis not present

## 2016-08-27 DIAGNOSIS — M791 Myalgia: Secondary | ICD-10-CM | POA: Diagnosis not present

## 2016-08-27 DIAGNOSIS — I10 Essential (primary) hypertension: Secondary | ICD-10-CM | POA: Diagnosis not present

## 2016-08-31 ENCOUNTER — Ambulatory Visit
Admission: RE | Admit: 2016-08-31 | Discharge: 2016-08-31 | Disposition: A | Discharge status: Home / Self Care | Payer: Medicare Other | Source: Ambulatory Visit | Attending: Family Medicine | Admitting: Family Medicine

## 2016-08-31 ENCOUNTER — Other Ambulatory Visit: Payer: Self-pay | Admitting: Family Medicine

## 2016-08-31 DIAGNOSIS — R928 Other abnormal and inconclusive findings on diagnostic imaging of breast: Secondary | ICD-10-CM

## 2016-08-31 DIAGNOSIS — N6311 Unspecified lump in the right breast, upper outer quadrant: Secondary | ICD-10-CM | POA: Diagnosis not present

## 2016-08-31 DIAGNOSIS — N6310 Unspecified lump in the right breast, unspecified quadrant: Secondary | ICD-10-CM

## 2016-08-31 DIAGNOSIS — N6011 Diffuse cystic mastopathy of right breast: Secondary | ICD-10-CM | POA: Diagnosis not present

## 2016-10-13 ENCOUNTER — Ambulatory Visit
Admission: RE | Admit: 2016-10-13 | Discharge: 2016-10-13 | Disposition: A | Discharge status: Home / Self Care | Payer: Medicare Other | Source: Ambulatory Visit | Attending: Family Medicine | Admitting: Family Medicine

## 2016-10-13 DIAGNOSIS — Z780 Asymptomatic menopausal state: Secondary | ICD-10-CM | POA: Diagnosis not present

## 2016-10-13 DIAGNOSIS — M8589 Other specified disorders of bone density and structure, multiple sites: Secondary | ICD-10-CM | POA: Diagnosis not present

## 2016-10-13 DIAGNOSIS — E2839 Other primary ovarian failure: Secondary | ICD-10-CM

## 2016-12-01 DIAGNOSIS — I10 Essential (primary) hypertension: Secondary | ICD-10-CM | POA: Diagnosis not present

## 2016-12-01 DIAGNOSIS — R7301 Impaired fasting glucose: Secondary | ICD-10-CM | POA: Diagnosis not present

## 2016-12-01 DIAGNOSIS — E782 Mixed hyperlipidemia: Secondary | ICD-10-CM | POA: Diagnosis not present

## 2016-12-01 DIAGNOSIS — M79602 Pain in left arm: Secondary | ICD-10-CM | POA: Diagnosis not present

## 2016-12-01 DIAGNOSIS — K5901 Slow transit constipation: Secondary | ICD-10-CM | POA: Diagnosis not present

## 2016-12-01 DIAGNOSIS — R0789 Other chest pain: Secondary | ICD-10-CM | POA: Diagnosis not present

## 2016-12-01 DIAGNOSIS — Z1159 Encounter for screening for other viral diseases: Secondary | ICD-10-CM | POA: Diagnosis not present

## 2016-12-02 ENCOUNTER — Encounter: Payer: Self-pay | Admitting: Gynecology

## 2016-12-11 ENCOUNTER — Ambulatory Visit (INDEPENDENT_AMBULATORY_CARE_PROVIDER_SITE_OTHER): Payer: Medicare Other | Admitting: Cardiology

## 2016-12-11 ENCOUNTER — Encounter: Payer: Self-pay | Admitting: Cardiology

## 2016-12-11 VITALS — BP 128/82 | HR 66 | Ht 63.0 in | Wt 162.8 lb

## 2016-12-11 DIAGNOSIS — R9431 Abnormal electrocardiogram [ECG] [EKG]: Secondary | ICD-10-CM | POA: Insufficient documentation

## 2016-12-11 DIAGNOSIS — R7303 Prediabetes: Secondary | ICD-10-CM

## 2016-12-11 DIAGNOSIS — E781 Pure hyperglyceridemia: Secondary | ICD-10-CM | POA: Diagnosis not present

## 2016-12-11 DIAGNOSIS — E7439 Other disorders of intestinal carbohydrate absorption: Secondary | ICD-10-CM | POA: Diagnosis not present

## 2016-12-11 DIAGNOSIS — I10 Essential (primary) hypertension: Secondary | ICD-10-CM | POA: Diagnosis not present

## 2016-12-11 DIAGNOSIS — Z8249 Family history of ischemic heart disease and other diseases of the circulatory system: Secondary | ICD-10-CM | POA: Diagnosis not present

## 2016-12-11 DIAGNOSIS — R079 Chest pain, unspecified: Secondary | ICD-10-CM | POA: Diagnosis not present

## 2016-12-11 NOTE — Progress Notes (Signed)
PCP: Rochel Brome, MD  Clinic Note: Chief Complaint  Patient presents with  . New Patient (Initial Visit)    Chest pain    HPI: Michelle Spears is a 71 y.o. female who is being seen today for the evaluation of Chest Pain (Left Arm Pain) at the request of Cox, Elnita Maxwell, MD.  Recent Hospitalizations: n/a  Studies Personally Reviewed - (if available, images/films reviewed: From Epic Chart or Care Everywhere)  n/a  Interval History: Michelle Spears presents here today for evaluation of chest discomfort. She is basically had 2 episodes. The initial event was what had a referred here. This was while sitting at her PCPs office she had a sensation of pain that started almost the epigastric region and radiated up around both sides of her collarbone to her neck. It lasted maybe a minute. She described as a sensation of a "balloon expanding in her chest" -maybe 5-6/10 in intensity.. It did take her breath away, but it resolved spontaneously. She had another episode about a week later at home that was very similar in nature. The only, now between these 2 episodes was there both days she was somewhat fatigued and had not eaten that day. She has not had any of this sensation with her exercise and walking. She may get a little bit short of breath as she is finishing up her 1 mile walk, but it does not limit her. Mostly her walking is limited by having plantar fasciitis that makes it very painful for her to walk. Therefore she does better in the exercise programs.  No chest pain or pressure with exertion. No resting dyspnea No PND, orthopnea or edema.  No palpitations, lightheadedness, dizziness, weakness or syncope/near syncope. No TIA/amaurosis fugax symptoms.  No claudication.  ROS: A comprehensive was performed. Review of Systems  Constitutional: Negative for malaise/fatigue and weight loss.  HENT: Positive for congestion. Negative for nosebleeds.   Eyes: Negative.   Respiratory: Positive for  shortness of breath (Per history of present illness).   Gastrointestinal: Negative for blood in stool, constipation and melena.  Genitourinary: Negative for hematuria.  Musculoskeletal: Negative for falls and joint pain.       Random but relatively frequent Cramps in legs @ night (can have cramps elsewhere)  Skin: Negative.   Neurological: Positive for headaches (She has a mild headache most days.).  Endo/Heme/Allergies: Positive for environmental allergies.  Psychiatric/Behavioral: Negative for depression and memory loss. The patient is not nervous/anxious and does not have insomnia.   All other systems reviewed and are negative.  I have reviewed and (if needed) personally updated the patient's problem list, medications, allergies, past medical and surgical history, social and family history.   Past Medical History:  Diagnosis Date  . Diverticulosis 2007  . Essential hypertension   . GERD (gastroesophageal reflux disease)   . Glucose intolerance    /Prediabetes -> most recent hemoglobin A1c was 6.6  . Headache(784.0)   . Hepatic cyst   . History of hysterectomy    nonmalignant reasons  . Hypertriglyceridemia   . Internal hemorrhoids   . Kidney stone 1975/2004  . PMS (premenstrual syndrome)   . Seasonal allergies   . Spastic colon   . Thyroid goiter    hx of    Past Surgical History:  Procedure Laterality Date  . ABDOMINAL HYSTERECTOMY    . bladder stent     for stones  . bladder stent removal    . childbirthx2    . COLONOSCOPY  2013  . GALLBLADDER SURGERY  12/2014  . [place]  . [place]  . TONSILLECTOMY    . TOTAL ABDOMINAL HYSTERECTOMY W/ BILATERAL SALPINGOOPHORECTOMY  1985  . WRIST SURGERY  2017    Current Meds  Medication Sig  . Ascorbic Acid (VITAMIN C) 1000 MG tablet Take 1,000 mg by mouth daily.  Marland Kitchen aspirin EC 81 MG tablet Take 81 mg by mouth daily.  Marland Kitchen atenolol-chlorthalidone (TENORETIC) 50-25 MG per tablet  One half tab every morning  . CALCIUM PO Take 500 mg by mouth 2 (two) times daily.   . cetirizine (ZYRTEC) 10 MG tablet TAKE 1-2 TIMES PER DAY FOR ITCHING  . fenofibrate micronized (LOFIBRA) 134 MG capsule Take 134 mg by mouth daily.  . fluticasone (FLONASE) 50 MCG/ACT nasal spray Place 1 spray into both nostrils daily as needed.   Marland Kitchen glucose blood test strip One touch verio, test once daily, Dx 250.00  . meloxicam (MOBIC) 15 MG tablet Take 15 mg by mouth daily.  . pantoprazole (PROTONIX) 40 MG tablet Take 1 tablet (40 mg total) by mouth daily before breakfast.    No Known Allergies  Social History   Social History  . Marital status: Married    Spouse name: N/A  . Number of children: 2  . Years of education: 12   Occupational History  . retired - Metallurgist   Social History Main Topics  . Smoking status: Former Smoker    Packs/day: 0.30    Years: 12.00    Types: Cigarettes    Quit date: 07/20/2004  . Smokeless tobacco: Never Used  . Alcohol use 0.0 oz/week     Comment: occ.   . Drug use: No  . Sexual activity: Not Asked   Other Topics Concern  . None   Social History Narrative   She lives in [place] with her spouse. They have 2 children and 4 grandchildren.   She quit smoking in 2006. Does not drink alcohol.   She works out 3 days a week for 1 1/2 hr with a workout program known as "shapes" - however that Clair Gulling is checked down, and she is now having to find a new program. 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    family history includes CAD in her other; Diabetes Mellitus II in her brother; Heart attack (age of onset: 92) in her father; Heart attack (age of onset: 66) in her paternal grandfather; High Cholesterol in her brother; Hyperlipidemia in her mother and other; Hypertension in her father; Multiple sclerosis in her sister; Obesity in her father.  Wt Readings from Last 3  Encounters:  12/11/16 162 lb 12.8 oz (73.8 kg)  08/01/15 162 lb 0.6 oz (73.5 kg)  12/24/14 159 lb 12.8 oz (72.5 kg)    PHYSICAL EXAM BP 128/82   Pulse 66   Ht 5\' 3"  (1.6 m)   Wt 162 lb 12.8 oz (73.8 kg)   BMI 28.84 kg/m  General appearance: alert, cooperative, appears stated age, no distress. Relatively well-nourished and well-groomed. Borderline overweight. HEENT: [place]/AT, EOMI, MMM, anicteric sclera Neck: no adenopathy, no carotid bruit and no JVD Lungs: clear to auscultation bilaterally, normal percussion bilaterally and non-labored Heart: regular rate and rhythm, S1 &S2 normal, no murmur, click, rub or gallop; non-displaced PMI Abdomen: soft, non-tender; bowel sounds normal; no masses,  no  organomegaly; no HJR Extremities: extremities normal, atraumatic, no cyanosis, or edema  Pulses: 2+ and symmetric;  Skin: mobility and turgor normal, no edema, no evidence of bleeding or bruising, no lesions noted, temperature normal and texture normal or  Neurologic: Mental status: Alert & oriented x 3, thought content appropriate; non-focal exam.  Pleasant mood & affect. Cranial nerves: normal (II-XII grossly intact)    Adult ECG Report  Rate: 66 ;  Rhythm: normal sinus rhythm and AnteroLateral T wave abnormality - cannot exclude ischemia;   Narrative Interpretation: abnormal EKG   Other studies Reviewed: Additional studies/ records that were reviewed today include:  Recent Labs:   Lab Results  Component Value Date   CREATININE 0.67 12/24/2014   BUN 18 12/24/2014   NA 139 12/24/2014   K 3.7 12/24/2014   CL 102 12/24/2014   CO2 28 12/24/2014   Chest CTA  ASSESSMENT / PLAN: Problem List Items Addressed This Visit    Abnormal electrocardiogram (ECG) (EKG)    She does have a somewhat concerning EKG with anterolateral T-wave inversions. This may just simply be a normal variant however with her chronic risk factors of glucose intolerance, hypertension and cardiac risk factors, we will  risk stratify with a coronary CTA and calcium score.      Relevant Orders   EKG 12-Lead   CT CORONARY MORPH W/CTA COR W/SCORE W/CA W/CM &/OR WO/CM   CT CORONARY FRACTIONAL FLOW RESERVE DATA PREP   CT CORONARY FRACTIONAL FLOW RESERVE FLUID ANALYSIS   Chest pain with moderate risk for cardiac etiology - Primary    Chest pain episodes she's having as far as the location and intensity could be anginal, however both episodes were rest and she is not having it with exertion. Concerning features however are an abnormal EKG with family history of significant CAD.  Plan: Cardiac risk stratification with chronic calcium score and coronary CTA. This will be done likely ON July once the CT scan she is in place.       Relevant Orders   EKG 12-Lead   CT CORONARY MORPH W/CTA COR W/SCORE W/CA W/CM &/OR WO/CM   CT CORONARY FRACTIONAL FLOW RESERVE DATA PREP   CT CORONARY FRACTIONAL FLOW RESERVE FLUID ANALYSIS   Essential hypertension (Chronic)    Relatively normal blood pressure. On atenolol/chlorthalidone      Relevant Medications   aspirin EC 81 MG tablet   Other Relevant Orders   EKG 12-Lead   Family history of premature coronary artery disease (Chronic)    Pretty significant family history of premature CAD on the paternal side of her family. Her mother side however does not have any. With this increased risk we will risk stratify with coronary CTA.      Relevant Orders   EKG 12-Lead   CT CORONARY MORPH W/CTA COR W/SCORE W/CA W/CM &/OR WO/CM   CT CORONARY FRACTIONAL FLOW RESERVE DATA PREP   CT CORONARY FRACTIONAL FLOW RESERVE FLUID ANALYSIS   RESOLVED: Glucose intolerance   Hypertriglyceridemia (Chronic)    On fenofibrate. Managed by PCP This plus glucose intolerance and hypertension would make the criteria for Metabolic Syndrome.      Relevant Medications   aspirin EC 81 MG tablet   Prediabetes    A1c of 6.6 is borderline if not diagnostic of diabetes. With added risk based on family  history of CAD would have low threshold for treating.         Current medicines are reviewed at length with the patient  today. (+/- concerns) n/a The following changes have been made: n/a  Patient Instructions  SCHEDULE AT [facility] has requested that you have cardiac CT. Cardiac computed tomography (CT) is a painless test that uses an x-ray machine to take clear, detailed pictures of your heart. For further information please visit [email or web address]. Please follow instruction sheet as given.   NO CHANGES WITH MEDICATION.   Your physician recommends that you schedule a follow-up appointment in 3 MONTHS WITH DR Ellyn Hack - F/U RESULTS   Studies Ordered:   Orders Placed This Encounter  Procedures  . CT CORONARY MORPH W/CTA COR W/SCORE W/CA W/CM &/OR WO/CM  . CT CORONARY FRACTIONAL FLOW RESERVE DATA PREP  . CT CORONARY FRACTIONAL FLOW RESERVE FLUID ANALYSIS  . EKG 12-Lead     Glenetta Hew, M.D., M.S. Interventional Cardiologist   Pager # [phone] Phone # [phone] [address]. [place], [place] [address]

## 2016-12-11 NOTE — Patient Instructions (Signed)
SCHEDULE AT [facility] has requested that you have cardiac CT. Cardiac computed tomography (CT) is a painless test that uses an x-ray machine to take clear, detailed pictures of your heart. For further information please visit [email or web address]. Please follow instruction sheet as given.   NO CHANGES WITH MEDICATION.   Your physician recommends that you schedule a follow-up appointment in [place] - F/U RESULTS

## 2016-12-13 ENCOUNTER — Encounter: Payer: Self-pay | Admitting: Cardiology

## 2016-12-13 DIAGNOSIS — E781 Pure hyperglyceridemia: Secondary | ICD-10-CM | POA: Insufficient documentation

## 2016-12-13 DIAGNOSIS — E7439 Other disorders of intestinal carbohydrate absorption: Secondary | ICD-10-CM | POA: Insufficient documentation

## 2016-12-13 NOTE — Assessment & Plan Note (Signed)
A1c of 6.6 is borderline if not diagnostic of diabetes. With added risk based on family history of CAD would have low threshold for treating.

## 2016-12-13 NOTE — Assessment & Plan Note (Signed)
Relatively normal blood pressure. On atenolol/chlorthalidone

## 2016-12-13 NOTE — Assessment & Plan Note (Addendum)
Pretty significant family history of premature CAD on the paternal side of her family. Her mother side however does not have any. With this increased risk we will risk stratify with coronary CTA.

## 2016-12-13 NOTE — Assessment & Plan Note (Signed)
She does have a somewhat concerning EKG with anterolateral T-wave inversions. This may just simply be a normal variant however with her chronic risk factors of glucose intolerance, hypertension and cardiac risk factors, we will risk stratify with a coronary CTA and calcium score.

## 2016-12-13 NOTE — Assessment & Plan Note (Addendum)
Chest pain episodes she's having as far as the location and intensity could be anginal, however both episodes were rest and she is not having it with exertion. Concerning features however are an abnormal EKG with family history of significant CAD.  Plan: Cardiac risk stratification with chronic calcium score and coronary CTA. This will be done likely ON July once the CT scan she is in place.

## 2016-12-13 NOTE — Assessment & Plan Note (Signed)
On fenofibrate. Managed by PCP This plus glucose intolerance and hypertension would make the criteria for Metabolic Syndrome.

## 2016-12-23 ENCOUNTER — Telehealth: Payer: Self-pay | Admitting: Cardiology

## 2016-12-23 NOTE — Telephone Encounter (Signed)
Received records from [facility] for appointment on 03/15/17 with Dr Ellyn Hack.  Records put with Dr Allison Quarry schedule for 03/15/17. lp

## 2017-01-17 HISTORY — PX: CT CTA CORONARY W/CA SCORE W/CM &/OR WO/CM: HXRAD787

## 2017-01-21 ENCOUNTER — Telehealth: Payer: Self-pay | Admitting: Cardiology

## 2017-01-21 NOTE — Telephone Encounter (Signed)
Patient was returning phone call to the nurse. Will route to the provider's nurse for her knowledge.

## 2017-01-21 NOTE — Telephone Encounter (Signed)
New message ° ° ° °Pt verbalized that she is returning call for rn  °

## 2017-01-25 ENCOUNTER — Encounter: Payer: Self-pay | Admitting: Cardiology

## 2017-02-05 ENCOUNTER — Ambulatory Visit (HOSPITAL_COMMUNITY)
Admission: RE | Admit: 2017-02-05 | Discharge: 2017-02-05 | Disposition: A | Discharge status: Home / Self Care | Payer: Medicare Other | Source: Ambulatory Visit | Attending: Cardiology | Admitting: Cardiology

## 2017-02-05 DIAGNOSIS — K760 Fatty (change of) liver, not elsewhere classified: Secondary | ICD-10-CM | POA: Insufficient documentation

## 2017-02-05 DIAGNOSIS — R9431 Abnormal electrocardiogram [ECG] [EKG]: Secondary | ICD-10-CM | POA: Insufficient documentation

## 2017-02-05 DIAGNOSIS — Z8249 Family history of ischemic heart disease and other diseases of the circulatory system: Secondary | ICD-10-CM | POA: Diagnosis not present

## 2017-02-05 DIAGNOSIS — R079 Chest pain, unspecified: Secondary | ICD-10-CM | POA: Diagnosis not present

## 2017-02-05 DIAGNOSIS — K7689 Other specified diseases of liver: Secondary | ICD-10-CM | POA: Diagnosis not present

## 2017-02-05 MED ORDER — IOPAMIDOL (ISOVUE-370) INJECTION 76%
INTRAVENOUS | Status: AC
  Administered 2017-02-05: 80 mL via INTRAVENOUS
  Filled 2017-02-05: qty 100

## 2017-02-05 MED ORDER — NITROGLYCERIN 0.4 MG SL SUBL
SUBLINGUAL_TABLET | SUBLINGUAL | Status: AC
  Filled 2017-02-05: qty 2

## 2017-02-05 MED ORDER — METOPROLOL TARTRATE 5 MG/5ML IV SOLN
5.0000 mg | INTRAVENOUS | Status: AC | PRN
  Administered 2017-02-05 (×2): 5 mg via INTRAVENOUS

## 2017-02-05 MED ORDER — NITROGLYCERIN 0.4 MG SL SUBL
0.8000 mg | SUBLINGUAL_TABLET | Freq: Once | SUBLINGUAL | Status: AC
  Administered 2017-02-05: 0.8 mg via SUBLINGUAL
  Filled 2017-02-05: qty 25

## 2017-02-05 MED ORDER — METOPROLOL TARTRATE 5 MG/5ML IV SOLN
INTRAVENOUS | Status: AC
Start: 2017-02-05 — End: 2017-02-05
  Filled 2017-02-05: qty 15

## 2017-02-12 ENCOUNTER — Other Ambulatory Visit: Payer: Self-pay

## 2017-02-12 ENCOUNTER — Ambulatory Visit (HOSPITAL_COMMUNITY): Payer: Medicare Other

## 2017-03-05 DIAGNOSIS — N3091 Cystitis, unspecified with hematuria: Secondary | ICD-10-CM | POA: Diagnosis not present

## 2017-03-15 ENCOUNTER — Encounter: Payer: Self-pay | Admitting: Cardiology

## 2017-03-15 ENCOUNTER — Ambulatory Visit (INDEPENDENT_AMBULATORY_CARE_PROVIDER_SITE_OTHER): Payer: Medicare Other | Admitting: Cardiology

## 2017-03-15 DIAGNOSIS — K760 Fatty (change of) liver, not elsewhere classified: Secondary | ICD-10-CM

## 2017-03-15 DIAGNOSIS — R079 Chest pain, unspecified: Secondary | ICD-10-CM | POA: Diagnosis not present

## 2017-03-15 DIAGNOSIS — I10 Essential (primary) hypertension: Secondary | ICD-10-CM | POA: Diagnosis not present

## 2017-03-15 DIAGNOSIS — R9431 Abnormal electrocardiogram [ECG] [EKG]: Secondary | ICD-10-CM | POA: Diagnosis not present

## 2017-03-15 DIAGNOSIS — Z8249 Family history of ischemic heart disease and other diseases of the circulatory system: Secondary | ICD-10-CM

## 2017-03-15 NOTE — Patient Instructions (Signed)
No change in medication.      Your physician recommends that you schedule a follow-up appointment on an as needed basis.

## 2017-03-15 NOTE — Progress Notes (Signed)
PCP: Michelle Brome, MD  Clinic Note: Chief Complaint  Patient presents with  . Follow-up    Initially seen for chest pain - > follow-up after coronary CTA    HPI: Michelle Spears is a 71 y.o. female who is being seen today for 2 month f/u for the evaluation of L sided CP at the request of Cox, Elnita Maxwell, MD.  Michelle Spears was Initially seen on 12/11/2016 to evaluate chest discomfort that was basically 2 episodes of pain in the epigastric region radiating to the side of her collarbone and neck. She felt as though it was a balloon expanding in her chest.  Recent Hospitalizations: None  Studies Personally Reviewed - (if available, images/films reviewed: From Epic Chart or Care Everywhere)  Coronary Calcium Score/CTA: Coronary calcium score equals 3 (45 percentile for age/sex matched control.). Normal coronary origin with right dominance. Minimal nonobstructive CAD and normal pulmonary artery size. No significant CAD noted.  Also noted on noncardiac review: Hepatic steatosis with a left lobe liver lobe cyst (roughly 3.7 cm (similar to 2006)  Interval History: Michelle Spears presents here today for follow-up and indicates that she has not had any more episodes of chest pain. She had maybe one or 2 in [place] and has not had any further since I saw her. She is not noticing any significant exertional dyspnea besides noting some deconditioning. No resting or exertional chest pain. No PND, orthopnea or edema.   No palpitations, lightheadedness, dizziness, weakness or syncope/near syncope. No TIA/amaurosis fugax symptoms. No claudication.  ROS: A comprehensive was not performed. Pertinent Sx noted in HPI. Review of Systems  HENT: Negative for congestion and nosebleeds.   Gastrointestinal: Negative for abdominal pain, blood in stool, constipation, diarrhea and melena.  Genitourinary: Negative for hematuria.  Musculoskeletal:       Night cramps  Neurological: Positive for headaches.    Endo/Heme/Allergies: Positive for environmental allergies.   I have reviewed and (if needed) personally updated the patient's problem list, medications, allergies, past medical and surgical history, social and family history.   Past Medical History:  Diagnosis Date  . Diverticulosis 2007  . Essential hypertension   . GERD (gastroesophageal reflux disease)   . Glucose intolerance    /Prediabetes -> most recent hemoglobin A1c was 6.6  . Headache(784.0)   . Hepatic cyst   . History of hysterectomy    nonmalignant reasons  . Hypertriglyceridemia   . Internal hemorrhoids   . Kidney stone 1975/2004  . PMS (premenstrual syndrome)   . Seasonal allergies   . Spastic colon   . Thyroid goiter    hx of    Past Surgical History:  Procedure Laterality Date  . ABDOMINAL HYSTERECTOMY    . bladder stent     for stones  . bladder stent removal    . childbirthx2    . COLONOSCOPY  2013  . GALLBLADDER SURGERY  12/2014  . [place]  . [place]  . TONSILLECTOMY    . TOTAL ABDOMINAL HYSTERECTOMY W/ BILATERAL SALPINGOOPHORECTOMY  1985  . WRIST SURGERY  2017    Current Meds  Medication Sig  . Ascorbic Acid (VITAMIN C) 1000 MG tablet Take 1,000 mg by mouth daily.  Marland Kitchen aspirin EC 81 MG tablet Take 81 mg by mouth daily.  Marland Kitchen atenolol-chlorthalidone (TENORETIC) 50-25 MG per tablet One half tab every morning  . CALCIUM PO Take 500 mg by mouth 2 (two) times daily.   Marland Kitchen  cetirizine (ZYRTEC) 10 MG tablet TAKE 1-2 TIMES PER DAY FOR ITCHING  . fenofibrate micronized (LOFIBRA) 134 MG capsule Take 134 mg by mouth daily.  . fluticasone (FLONASE) 50 MCG/ACT nasal spray Place 1 spray into both nostrils daily as needed.   Marland Kitchen glucose blood test strip One touch verio, test once daily, Dx 250.00  . meloxicam (MOBIC) 15 MG tablet Take 15 mg by mouth daily.  . Omega-3 Fatty Acids (FISH OIL) 1200 MG CAPS Take 1 capsule by mouth daily.  . pantoprazole (PROTONIX) 40 MG  tablet Take 1 tablet (40 mg total) by mouth daily before breakfast.    No Known Allergies  Social History   Social History  . Marital status: Married    Spouse name: N/A  . Number of children: 2  . Years of education: 12   Occupational History  . retired - Metallurgist   Social History Main Topics  . Smoking status: Former Smoker    Packs/day: 0.30    Years: 12.00    Types: Cigarettes    Quit date: 07/20/2004  . Smokeless tobacco: Never Used  . Alcohol use 0.0 oz/week     Comment: occ.   . Drug use: No  . Sexual activity: Not Asked   Other Topics Concern  . None   Social History Narrative   She lives in [place] with her spouse. They have 2 children and 4 grandchildren.   She quit smoking in 2006. Does not drink alcohol.   She works out 3 days a week for 1 1/2 hr with a workout program known as "[employer]" - however that Michelle Spears is checked down, and she is now having to find a new program. 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    family history includes CAD in her other; Diabetes Mellitus II in her brother; Heart attack (age of onset: 9) in her father; Heart attack (age of onset: 42) in her paternal grandfather; High Cholesterol in her brother; Hyperlipidemia in her mother and other; Hypertension in her father; Multiple sclerosis in her sister; Obesity in her father.  Wt Readings from Last 3 Encounters:  03/15/17 161 lb (73 kg)  12/11/16 162 lb 12.8 oz (73.8 kg)  08/01/15 162 lb 0.6 oz (73.5 kg)    PHYSICAL EXAM BP 128/74 (BP Location: Right Arm, Patient Position: Sitting, Cuff Size: Normal)   Pulse 90   Ht 5\' 3"  (1.6 m)   Wt 161 lb (73 kg)   BMI 28.52 kg/m  Physical Exam  Constitutional: She is oriented to person, place, and time. She appears well-developed and well-nourished. No distress.  HENT:  Head: Normocephalic and atraumatic.  Neck: No hepatojugular reflux and no  JVD present. Carotid bruit is not present.  Cardiovascular: Normal rate, regular rhythm, normal heart sounds and intact distal pulses.  Exam reveals no gallop and no friction rub.   No murmur heard. Pulmonary/Chest: Effort normal and breath sounds normal. No respiratory distress. She has no wheezes.  Abdominal: Soft. Bowel sounds are normal. She exhibits no distension. There is no tenderness.  Musculoskeletal: Normal range of motion. She exhibits no edema.  Neurological: She is alert and oriented to person, place, and time.  Psychiatric: She has a normal mood and affect. Her behavior is normal. Judgment and thought content normal.  Nursing note and vitals reviewed.    Adult ECG Report N/a  Other  studies Reviewed: Additional studies/ records that were reviewed today include:  Recent Labs:   n/a  ASSESSMENT / PLAN: Problem List Items Addressed This Visit    Abnormal electrocardiogram (ECG) (EKG) (Chronic)    She did have anterolateral T-wave inversions on EKG which are probably normal variant. Thankfully, coronary CTA did not show any signs of ischemic heart disease. This would indicate the T-wave inversions are simply repolarization abnormalities and not related to CAD.      Chest pain with low risk for cardiac etiology    With a significant family history of CAD, it is reassuring to see that her cord gases score  was only 3 with no significant coronary disease noted on CT angiography. This would indicate that her chest discomfort was probably not anginal CAD related.   Thankfully, she's not had any more symptoms of what sounds more consistent with GI symptoms.      Essential hypertension (Chronic)    Controlled on current medications.      Family history of premature coronary artery disease (Chronic)    Coronary CTA was essentially rules out ischemic coronary disease. Continue risk factor modification, but unlikely for her to have an acute coronary event in the near future.        Hepatic steatosis    Hepatic steatosis is noted on her CT scan over read by radiology. She has had a history of hepatic cysts in the past, and the current cyst appears to be stable. Will simply defer to PCP for further follow-up and evaluation.         Current medicines are reviewed at length with the patient today. (+/- concerns) none The following changes have been made: none  Patient Instructions  No change in medication.      Your physician recommends that you schedule a follow-up appointment on an as needed basis.    Studies Ordered:   No orders of the defined types were placed in this encounter.     Glenetta Hew, M.D., M.S. Interventional Cardiologist   Pager # [phone] Phone # [phone] [address] [place], [place] [address]

## 2017-03-17 ENCOUNTER — Encounter: Payer: Self-pay | Admitting: Cardiology

## 2017-03-17 NOTE — Assessment & Plan Note (Signed)
Coronary CTA was essentially rules out ischemic coronary disease. Continue risk factor modification, but unlikely for her to have an acute coronary event in the near future.

## 2017-03-17 NOTE — Assessment & Plan Note (Signed)
Controlled on current medications 

## 2017-03-17 NOTE — Assessment & Plan Note (Signed)
Hepatic steatosis is noted on her CT scan over read by radiology. She has had a history of hepatic cysts in the past, and the current cyst appears to be stable. Will simply defer to PCP for further follow-up and evaluation.

## 2017-03-17 NOTE — Assessment & Plan Note (Addendum)
With a significant family history of CAD, it is reassuring to see that her cord gases score  was only 3 with no significant coronary disease noted on CT angiography. This would indicate that her chest discomfort was probably not anginal CAD related.   Thankfully, she's not had any more symptoms of what sounds more consistent with GI symptoms.

## 2017-03-17 NOTE — Assessment & Plan Note (Signed)
She did have anterolateral T-wave inversions on EKG which are probably normal variant. Thankfully, coronary CTA did not show any signs of ischemic heart disease. This would indicate the T-wave inversions are simply repolarization abnormalities and not related to CAD.

## 2017-03-23 DIAGNOSIS — H35033 Hypertensive retinopathy, bilateral: Secondary | ICD-10-CM | POA: Diagnosis not present

## 2017-03-24 DIAGNOSIS — M791 Myalgia: Secondary | ICD-10-CM | POA: Diagnosis not present

## 2017-03-24 DIAGNOSIS — R7301 Impaired fasting glucose: Secondary | ICD-10-CM | POA: Diagnosis not present

## 2017-03-24 DIAGNOSIS — Z23 Encounter for immunization: Secondary | ICD-10-CM | POA: Diagnosis not present

## 2017-03-24 DIAGNOSIS — E782 Mixed hyperlipidemia: Secondary | ICD-10-CM | POA: Diagnosis not present

## 2017-03-24 DIAGNOSIS — I10 Essential (primary) hypertension: Secondary | ICD-10-CM | POA: Diagnosis not present

## 2017-03-30 DIAGNOSIS — M545 Low back pain: Secondary | ICD-10-CM | POA: Diagnosis not present

## 2017-04-12 DIAGNOSIS — M545 Low back pain: Secondary | ICD-10-CM | POA: Diagnosis not present

## 2017-06-25 DIAGNOSIS — E782 Mixed hyperlipidemia: Secondary | ICD-10-CM | POA: Diagnosis not present

## 2017-06-25 DIAGNOSIS — I10 Essential (primary) hypertension: Secondary | ICD-10-CM | POA: Diagnosis not present

## 2017-06-25 DIAGNOSIS — K580 Irritable bowel syndrome with diarrhea: Secondary | ICD-10-CM | POA: Diagnosis not present

## 2017-06-25 DIAGNOSIS — S22000A Wedge compression fracture of unspecified thoracic vertebra, initial encounter for closed fracture: Secondary | ICD-10-CM | POA: Diagnosis not present

## 2017-06-25 DIAGNOSIS — E1142 Type 2 diabetes mellitus with diabetic polyneuropathy: Secondary | ICD-10-CM | POA: Diagnosis not present

## 2017-06-25 DIAGNOSIS — M25512 Pain in left shoulder: Secondary | ICD-10-CM | POA: Diagnosis not present

## 2017-08-11 DIAGNOSIS — Z1231 Encounter for screening mammogram for malignant neoplasm of breast: Secondary | ICD-10-CM | POA: Diagnosis not present

## 2017-08-11 DIAGNOSIS — Z6828 Body mass index (BMI) 28.0-28.9, adult: Secondary | ICD-10-CM | POA: Diagnosis not present

## 2017-08-11 DIAGNOSIS — E663 Overweight: Secondary | ICD-10-CM | POA: Diagnosis not present

## 2017-08-11 DIAGNOSIS — Z0000 Encounter for general adult medical examination without abnormal findings: Secondary | ICD-10-CM | POA: Diagnosis not present

## 2017-08-13 ENCOUNTER — Other Ambulatory Visit: Payer: Self-pay | Admitting: Family Medicine

## 2017-08-13 DIAGNOSIS — Z1231 Encounter for screening mammogram for malignant neoplasm of breast: Secondary | ICD-10-CM

## 2017-08-25 DIAGNOSIS — E1142 Type 2 diabetes mellitus with diabetic polyneuropathy: Secondary | ICD-10-CM | POA: Diagnosis not present

## 2017-08-25 DIAGNOSIS — R300 Dysuria: Secondary | ICD-10-CM | POA: Diagnosis not present

## 2017-08-25 DIAGNOSIS — N952 Postmenopausal atrophic vaginitis: Secondary | ICD-10-CM | POA: Diagnosis not present

## 2017-09-01 ENCOUNTER — Ambulatory Visit
Admission: RE | Admit: 2017-09-01 | Discharge: 2017-09-01 | Disposition: A | Discharge status: Home / Self Care | Payer: Medicare Other | Source: Ambulatory Visit | Attending: Family Medicine | Admitting: Family Medicine

## 2017-09-01 DIAGNOSIS — Z1231 Encounter for screening mammogram for malignant neoplasm of breast: Secondary | ICD-10-CM

## 2017-09-10 DIAGNOSIS — K580 Irritable bowel syndrome with diarrhea: Secondary | ICD-10-CM | POA: Diagnosis not present

## 2017-09-10 DIAGNOSIS — N3000 Acute cystitis without hematuria: Secondary | ICD-10-CM | POA: Diagnosis not present

## 2017-09-24 DIAGNOSIS — K580 Irritable bowel syndrome with diarrhea: Secondary | ICD-10-CM | POA: Diagnosis not present

## 2017-09-24 DIAGNOSIS — I10 Essential (primary) hypertension: Secondary | ICD-10-CM | POA: Diagnosis not present

## 2017-09-24 DIAGNOSIS — E782 Mixed hyperlipidemia: Secondary | ICD-10-CM | POA: Diagnosis not present

## 2017-09-24 DIAGNOSIS — E1165 Type 2 diabetes mellitus with hyperglycemia: Secondary | ICD-10-CM | POA: Diagnosis not present

## 2017-09-24 DIAGNOSIS — E1142 Type 2 diabetes mellitus with diabetic polyneuropathy: Secondary | ICD-10-CM | POA: Diagnosis not present

## 2017-12-30 DIAGNOSIS — I10 Essential (primary) hypertension: Secondary | ICD-10-CM | POA: Diagnosis not present

## 2017-12-30 DIAGNOSIS — E782 Mixed hyperlipidemia: Secondary | ICD-10-CM | POA: Diagnosis not present

## 2017-12-30 DIAGNOSIS — E1142 Type 2 diabetes mellitus with diabetic polyneuropathy: Secondary | ICD-10-CM | POA: Diagnosis not present

## 2017-12-30 DIAGNOSIS — Z6828 Body mass index (BMI) 28.0-28.9, adult: Secondary | ICD-10-CM | POA: Diagnosis not present

## 2018-02-24 DIAGNOSIS — I10 Essential (primary) hypertension: Secondary | ICD-10-CM | POA: Diagnosis not present

## 2018-02-24 DIAGNOSIS — N200 Calculus of kidney: Secondary | ICD-10-CM | POA: Insufficient documentation

## 2018-02-24 DIAGNOSIS — E048 Other specified nontoxic goiter: Secondary | ICD-10-CM | POA: Diagnosis not present

## 2018-02-24 DIAGNOSIS — K760 Fatty (change of) liver, not elsewhere classified: Secondary | ICD-10-CM | POA: Diagnosis not present

## 2018-02-24 DIAGNOSIS — K219 Gastro-esophageal reflux disease without esophagitis: Secondary | ICD-10-CM | POA: Diagnosis not present

## 2018-02-24 DIAGNOSIS — M899 Disorder of bone, unspecified: Secondary | ICD-10-CM | POA: Insufficient documentation

## 2018-02-24 DIAGNOSIS — E7849 Other hyperlipidemia: Secondary | ICD-10-CM | POA: Diagnosis not present

## 2018-02-24 DIAGNOSIS — E1169 Type 2 diabetes mellitus with other specified complication: Secondary | ICD-10-CM | POA: Diagnosis not present

## 2018-02-24 DIAGNOSIS — M8580 Other specified disorders of bone density and structure, unspecified site: Secondary | ICD-10-CM | POA: Diagnosis not present

## 2018-02-24 DIAGNOSIS — N6019 Diffuse cystic mastopathy of unspecified breast: Secondary | ICD-10-CM | POA: Diagnosis not present

## 2018-02-24 DIAGNOSIS — E049 Nontoxic goiter, unspecified: Secondary | ICD-10-CM | POA: Insufficient documentation

## 2018-02-24 DIAGNOSIS — Z6827 Body mass index (BMI) 27.0-27.9, adult: Secondary | ICD-10-CM | POA: Diagnosis not present

## 2018-03-17 DIAGNOSIS — I10 Essential (primary) hypertension: Secondary | ICD-10-CM | POA: Diagnosis not present

## 2018-03-17 DIAGNOSIS — Z23 Encounter for immunization: Secondary | ICD-10-CM | POA: Diagnosis not present

## 2018-03-17 DIAGNOSIS — Z6827 Body mass index (BMI) 27.0-27.9, adult: Secondary | ICD-10-CM | POA: Diagnosis not present

## 2018-03-17 DIAGNOSIS — E1139 Type 2 diabetes mellitus with other diabetic ophthalmic complication: Secondary | ICD-10-CM | POA: Diagnosis not present

## 2018-03-17 DIAGNOSIS — E7849 Other hyperlipidemia: Secondary | ICD-10-CM | POA: Diagnosis not present

## 2018-03-17 DIAGNOSIS — E1169 Type 2 diabetes mellitus with other specified complication: Secondary | ICD-10-CM | POA: Diagnosis not present

## 2018-03-17 DIAGNOSIS — E049 Nontoxic goiter, unspecified: Secondary | ICD-10-CM | POA: Diagnosis not present

## 2018-03-28 DIAGNOSIS — H35372 Puckering of macula, left eye: Secondary | ICD-10-CM | POA: Diagnosis not present

## 2018-03-28 DIAGNOSIS — H35033 Hypertensive retinopathy, bilateral: Secondary | ICD-10-CM | POA: Diagnosis not present

## 2018-06-06 DIAGNOSIS — H35372 Puckering of macula, left eye: Secondary | ICD-10-CM | POA: Diagnosis not present

## 2018-06-11 DIAGNOSIS — R1084 Generalized abdominal pain: Secondary | ICD-10-CM | POA: Diagnosis not present

## 2018-06-22 ENCOUNTER — Other Ambulatory Visit: Payer: Self-pay

## 2018-06-22 ENCOUNTER — Ambulatory Visit (INDEPENDENT_AMBULATORY_CARE_PROVIDER_SITE_OTHER): Payer: Medicare Other | Admitting: Nurse Practitioner

## 2018-06-22 ENCOUNTER — Encounter: Payer: Self-pay | Admitting: Nurse Practitioner

## 2018-06-22 ENCOUNTER — Other Ambulatory Visit (INDEPENDENT_AMBULATORY_CARE_PROVIDER_SITE_OTHER): Payer: Medicare Other

## 2018-06-22 VITALS — BP 138/82 | HR 64 | Ht 63.0 in | Wt 149.4 lb

## 2018-06-22 DIAGNOSIS — R1030 Lower abdominal pain, unspecified: Secondary | ICD-10-CM | POA: Diagnosis not present

## 2018-06-22 DIAGNOSIS — R194 Change in bowel habit: Secondary | ICD-10-CM

## 2018-06-22 LAB — CBC WITH DIFFERENTIAL/PLATELET
BASOS PCT: 0.5 % (ref 0.0–3.0)
Basophils Absolute: 0 10*3/uL (ref 0.0–0.1)
EOS PCT: 3.1 % (ref 0.0–5.0)
Eosinophils Absolute: 0.2 10*3/uL (ref 0.0–0.7)
HCT: 40.7 % (ref 36.0–46.0)
Hemoglobin: 14.1 g/dL (ref 12.0–15.0)
Lymphocytes Relative: 21.5 % (ref 12.0–46.0)
Lymphs Abs: 1.7 10*3/uL (ref 0.7–4.0)
MCHC: 34.7 g/dL (ref 30.0–36.0)
MCV: 94.8 fl (ref 78.0–100.0)
Monocytes Absolute: 0.6 10*3/uL (ref 0.1–1.0)
Monocytes Relative: 7.6 % (ref 3.0–12.0)
Neutro Abs: 5.3 10*3/uL (ref 1.4–7.7)
Neutrophils Relative %: 67.3 % (ref 43.0–77.0)
Platelets: 298 10*3/uL (ref 150.0–400.0)
RBC: 4.3 Mil/uL (ref 3.87–5.11)
RDW: 12.6 % (ref 11.5–15.5)
WBC: 7.9 10*3/uL (ref 4.0–10.5)

## 2018-06-22 LAB — URINALYSIS, ROUTINE W REFLEX MICROSCOPIC
Bilirubin Urine: NEGATIVE
Hgb urine dipstick: NEGATIVE
Ketones, ur: NEGATIVE
Leukocytes, UA: NEGATIVE
Nitrite: NEGATIVE
RBC / HPF: NONE SEEN (ref 0–?)
Specific Gravity, Urine: <=1.005 — AB (ref 1.000–1.030)
Total Protein, Urine: NEGATIVE
Urine Glucose: NEGATIVE
Urobilinogen, UA: 0.2 (ref 0.0–1.0)
pH: 7 (ref 5.0–8.0)

## 2018-06-22 LAB — C.DIFFICILE TOXIN: C. DIFFICILE TOXIN A: NOT DETECTED

## 2018-06-22 MED ORDER — METRONIDAZOLE 500 MG PO TABS: 500.0000 mg | ORAL_TABLET | Freq: Three times a day (TID) | ORAL | 0 refills | Status: DC

## 2018-06-22 MED ORDER — METRONIDAZOLE 500 MG PO TABS: 500.0000 mg | ORAL_TABLET | Freq: Two times a day (BID) | ORAL | 0 refills | Status: DC

## 2018-06-22 MED ORDER — CIPROFLOXACIN HCL 250 MG PO TABS: 250.0000 mg | ORAL_TABLET | Freq: Two times a day (BID) | ORAL | 0 refills | Status: DC

## 2018-06-22 NOTE — Patient Instructions (Addendum)
If you are age 71 or older, your body mass index should be between 23-30. Your Body mass index is 26.47 kg/m. If this is out of the aforementioned range listed, please consider follow up with your Primary Care Provider.  If you are age 43 or younger, your body mass index should be between 19-25. Your Body mass index is 26.47 kg/m. If this is out of the aformentioned range listed, please consider follow up with your Primary Care Provider.   Your provider has requested that you go to the basement level for lab work before leaving today. Press "B" on the elevator. The lab is located at the first door on the left as you exit the elevator.  We have sent the following medications to your pharmacy for you to pick up at your convenience: Cipro Flagyl  Follow up with me on July 05, 2018 at 8:30 a.m.  Thank you for choosing me and [facility].   Tye Savoy, NP

## 2018-06-22 NOTE — Progress Notes (Signed)
ASSESSMENT / PLAN:     72 year old female with recent episode of lower abdominal pain, nausea, fever, and apparently leukocytosis with WBC of 16K at an acute care center and [place] last week.  Treated with 1 week of Cipro / Flagyl, presumably for diverticulitis.  Patient gives a history of recurrent diverticulitis, last episode ~ 2015 (CT scan in [place]).  She also reports findings of diverticulitis on her last colonoscopy.  -I reviewed records in [place].  In 2013 patient had a CT scan for lower abdominal pain.  There were findings of diverticulosis but not diverticulitis.  Additionally, I reviewed 2013 colonoscopy with Ms. Bracy at which time she had diverticulosis. I explained the difference between diverticulosis and diverticulitis.   -It does sound like recent illness was secondary to diverticulitis but we should have documentation of such at some point time.  Patient has her 2015 CT scan at home and she will call us with those results for our documentation.  -In the interim, she is having recurrence of lower abdominal pressure and is concerned about recurrence of diverticulitis.  She had a week of antibiotics, I am going to extend that by an additional week. -We will obtain CBC today -obtain u/a -She has had some mild changes in bowels associated with this recent episode of pain. Stools have been a little more runny and frequent.  Infectious etiology sounds unlikely but I think it should be excluded since we do not know for sure that she has diverticulitis.  Will obtain stool for C. Difficile -If above studies are negative and patient does not improve with antibiotics then CT scan of the abdomen and pelvis is next  HPI:    Chief complaint: Abdominal pain   Patient is a 72 year old female known to Dr. Carlean Purl but not seen in a few years.  He has only followed her for history of GERD and also possible IBS with loose stools.  She had screening colonoscopy in 2013.  The  exam was complete with an excellent prep and findings of only diverticulosis and hemorrhoids  Ms. Talwar is here for evaluation of lower abdominal pain.  She gives a history of recurrent diverticulitis, last episode ~ 2015 diagnosed in [place], [place].  On 05/28/2018 she developed mid lower abdominal pain.  The pain was constant, it hurt to walk.  Pain lasted for approximately 3 days and resolved.  On 11/22 the lower abdominal pain returned.  It actually started in her lower back then eventually moved around to her lower abdomen.  She had associated nausea and fever up to 101.  Patient went to an acute care center and [place] on the following Saturday.  Labs were done, apparently her white count was 16,000.  She was given a week of Cipro and Flagyl for possible diverticulitis.  Her pain, nausea improved and eventually resolved.  She completed the antibiotics this past Saturday.  Yesterday she noticed recurrent pressure in her mid lower abdomen.  No dysuria or hematuria.  She also complains of some burning in her right groin but she has had this before.  Ms. Rahe give a history of chronic loose stools basically all of her life. She typically has 3-4.  Loose, nonbloody stools a day.  With the onset of this abdominal pain however her stools have been a little more runny and frequent.  She had not had any antibiotics prior to the onset of this  mild change in bowels.  Her bowel movements have not returned to baseline despite recent antibiotics.    Past Medical History:  Diagnosis Date  . Diverticulosis 2007  . Essential hypertension   . GERD (gastroesophageal reflux disease)   . Glucose intolerance    /Prediabetes -> most recent hemoglobin A1c was 6.6  . Headache(784.0)   . Hepatic cyst   . History of hysterectomy    nonmalignant reasons  . Hypertriglyceridemia   . Internal hemorrhoids   . Kidney stone 1975/2004  . PMS (premenstrual syndrome)   . Seasonal allergies   . Spastic colon   . Thyroid  goiter    hx of     Past Surgical History:  Procedure Laterality Date  . ABDOMINAL HYSTERECTOMY    . bladder stent     for stones  . bladder stent removal    . childbirthx2    . COLONOSCOPY  2013  . GALLBLADDER SURGERY  12/2014  . [place]  . [place]  . TONSILLECTOMY    . TOTAL ABDOMINAL HYSTERECTOMY W/ BILATERAL SALPINGOOPHORECTOMY  1985  . WRIST SURGERY  2017   Family History  Problem Relation Age of Onset  . Hyperlipidemia Mother        Has chronic back pain  . Obesity Father   . Heart attack Father 65       Died from "massive MI "  . Hypertension Father   . Hyperlipidemia Other        siblings  . CAD Other        Several paternal uncles and aunts all with CAD before age 44  . Multiple sclerosis Sister   . Diabetes Mellitus II Brother   . Heart attack Paternal Grandfather 62  . High Cholesterol Brother    Social History   Tobacco Use  . Smoking status: Former Smoker    Packs/day: 0.30    Years: 12.00    Pack years: 3.60    Types: Cigarettes    Last attempt to quit: 07/20/2004    Years since quitting: 13.9  . Smokeless tobacco: Never Used  Substance Use Topics  . Alcohol use: Yes    Alcohol/week: 0.0 standard drinks    Comment: occ.   . Drug use: No   Current Outpatient Medications  Medication Sig Dispense Refill  . Ascorbic Acid (VITAMIN C) 1000 MG tablet Take 1,000 mg by mouth daily.    Marland Kitchen aspirin EC 81 MG tablet Take 81 mg by mouth daily.    Marland Kitchen atenolol-chlorthalidone (TENORETIC) 50-25 MG per tablet One half tab every morning 90 tablet 3  . CALCIUM PO Take 500 mg by mouth 2 (two) times daily.     . cetirizine (ZYRTEC) 10 MG tablet TAKE 1-2 TIMES PER DAY FOR ITCHING 60 tablet 5  . fenofibrate micronized (LOFIBRA) 134 MG capsule Take 134 mg by mouth daily.  1  . fluticasone (FLONASE) 50 MCG/ACT nasal spray Place 1 spray into both nostrils daily as needed.     Marland Kitchen glucose blood test strip One touch verio, test  once daily, Dx 250.00 100 each 3  . Omega-3 Fatty Acids (FISH OIL) 1200 MG CAPS Take 1 capsule by mouth daily.    . pantoprazole (PROTONIX) 40 MG tablet Take 1 tablet (40 mg total) by mouth daily before breakfast. 90 tablet 3   No current facility-administered medications for this visit.    No Known Allergies   Review of Systems: All  systems reviewed and negative except where noted in HPI.   Creatinine clearance cannot be calculated (Patient's most recent lab result is older than the maximum 21 days allowed.)   Physical Exam:    Wt Readings from Last 3 Encounters:  06/22/18 149 lb 6.4 oz (67.8 kg)  03/15/17 161 lb (73 kg)  12/11/16 162 lb 12.8 oz (73.8 kg)    Ht 5\' 3"  (1.6 m)   Wt 149 lb 6.4 oz (67.8 kg)   BMI 26.47 kg/m  Constitutional:  Pleasant female in no acute distress. Psychiatric: Normal mood and affect. Behavior is normal. EENT: Pupils normal.  Conjunctivae are normal. No scleral icterus. Neck supple.  Cardiovascular: Normal rate, regular rhythm. No edema Pulmonary/chest: Effort normal and breath sounds normal. No wheezing, rales or rhonchi. Abdominal: Soft, nondistended, mild LLQ tenderness. Bowel sounds active throughout. There are no masses palpable. No hepatomegaly. Neurological: Alert and oriented to person place and time. Skin: Skin is warm and dry. No rashes noted.  Tye Savoy, NP  06/22/2018, 8:40 AM

## 2018-06-27 DIAGNOSIS — K760 Fatty (change of) liver, not elsewhere classified: Secondary | ICD-10-CM | POA: Diagnosis not present

## 2018-06-27 DIAGNOSIS — E1169 Type 2 diabetes mellitus with other specified complication: Secondary | ICD-10-CM | POA: Diagnosis not present

## 2018-06-27 DIAGNOSIS — N200 Calculus of kidney: Secondary | ICD-10-CM | POA: Diagnosis not present

## 2018-06-27 DIAGNOSIS — Z6826 Body mass index (BMI) 26.0-26.9, adult: Secondary | ICD-10-CM | POA: Diagnosis not present

## 2018-06-27 DIAGNOSIS — K5792 Diverticulitis of intestine, part unspecified, without perforation or abscess without bleeding: Secondary | ICD-10-CM | POA: Diagnosis not present

## 2018-06-27 DIAGNOSIS — M859 Disorder of bone density and structure, unspecified: Secondary | ICD-10-CM | POA: Diagnosis not present

## 2018-06-27 DIAGNOSIS — E048 Other specified nontoxic goiter: Secondary | ICD-10-CM | POA: Diagnosis not present

## 2018-06-27 DIAGNOSIS — E7849 Other hyperlipidemia: Secondary | ICD-10-CM | POA: Diagnosis not present

## 2018-07-05 ENCOUNTER — Encounter: Payer: Self-pay | Admitting: Nurse Practitioner

## 2018-07-05 ENCOUNTER — Ambulatory Visit (INDEPENDENT_AMBULATORY_CARE_PROVIDER_SITE_OTHER): Payer: Medicare Other | Admitting: Nurse Practitioner

## 2018-07-05 VITALS — BP 120/64 | HR 68 | Ht 63.0 in | Wt 149.0 lb

## 2018-07-05 DIAGNOSIS — R197 Diarrhea, unspecified: Secondary | ICD-10-CM

## 2018-07-05 NOTE — Patient Instructions (Signed)
If you are age 71 or older, your body mass index should be between 23-30. Your Body mass index is 26.39 kg/m. If this is out of the aforementioned range listed, please consider follow up with your Primary Care Provider.  If you are age 62 or younger, your body mass index should be between 19-25. Your Body mass index is 26.39 kg/m. If this is out of the aformentioned range listed, please consider follow up with your Primary Care Provider.   VSL #3 112 - over-the-counter.  Take two twice daily for two weeks, then two daily for two weeks.  Call in one month with an update.  Thank you for choosing me and [facility].   Tye Savoy, NP

## 2018-07-05 NOTE — Progress Notes (Signed)
Chief Complaint:    Diarrhea follow up   IMPRESSION and PLAN:    Loose stool, intermittent lower abdominal pain.  CBC unremarkable, C. difficile negative. Right now patient has no alarm features except she reports a recent 10 pound weight loss.  No documented weights from 2019 but in August 2018 she was 12 pounds heavier.  -We will try a course of VSL#3.  She will call me in 4 weeks with a condition update.  She will call sooner worsening diarrhea, if she develops blood in stool, fevers, etc. -For the next several days patient will also make note of whether dairy or other particular foods affect her bowel habits -Further recommendation pending clinical course -She is not due for screening colonoscopy until 2023  HPI:     Patient is a 72 year old female known to Dr. Carlean Purl, basically for history of GERD.  I saw her a few weeks ago for evaluation of what was likely a bout of diverticulitis treated at an acute care center and [place].  Patient was treated with Cipro and Flagyl.  She has a known history of diverticulosis but I was unable to find any documentation of actual diverticulitis however.  When I saw her in clinic she was having a recurrence of lower abdominal pressure, I extended antibiotics by 1 week with plans for CT scan if symptoms did not improve.  I also obtain a UA and CBC.  Patient was also complaining of some mild bowel habit changes in the form of frequent loose stools.  C. difficile study was ordered.  She is here for follow-up.  Husband accompanies patient today and he feels like her bowel habits have been irregular for many, many years.  Patient does agree that she does have loose stools from time to time it is just that now she never seems to have any formed ones  Her CBC was normal.  WBC which was apparently 16,000 at the acute care center and random and had actually normalized to 7.9.  Stool for C. difficile was negative.  U/A was unremarkable.  She still has lower  abdominal pain from time to time.  She has not paid attention to whether the pain gets better with bowel movements.  The pain is sometimes in the left lower quadrant, other times in the right lower quadrant.  She is still having loose stool.  On average she has 3 loose, non-bloody stools a day.  No fevers.  She is not paid attention to whether any certain foods such as dairy affects bowel habits  Review of systems:     No chest pain, no SOB, no fevers, no urinary sx   Past Medical History:  Diagnosis Date  . Diverticulosis 2007  . Essential hypertension   . GERD (gastroesophageal reflux disease)   . Glucose intolerance    /Prediabetes -> most recent hemoglobin A1c was 6.6  . Headache(784.0)   . Hepatic cyst   . History of hysterectomy    nonmalignant reasons  . Hypertriglyceridemia   . Internal hemorrhoids   . Kidney stone 1975/2004  . PMS (premenstrual syndrome)   . Seasonal allergies   . Spastic colon   . Thyroid goiter    hx of    Patient's surgical history, family medical history, social history, medications and allergies were all reviewed in Epic   Creatinine clearance cannot be calculated (Patient's most recent lab result is older than the maximum 21 days allowed.)  Current Outpatient Medications  Medication Sig Dispense Refill  . Ascorbic Acid (VITAMIN C) 1000 MG tablet Take 1,000 mg by mouth daily.    Marland Kitchen aspirin EC 81 MG tablet Take 81 mg by mouth daily.    Marland Kitchen atenolol-chlorthalidone (TENORETIC) 50-25 MG per tablet One half tab every morning 90 tablet 3  . atorvastatin (LIPITOR) 10 MG tablet Take 10 mg by mouth daily.    Marland Kitchen CALCIUM PO Take 500 mg by mouth 2 (two) times daily.     . cetirizine (ZYRTEC) 10 MG tablet TAKE 1-2 TIMES PER DAY FOR ITCHING 60 tablet 5  . fenofibrate micronized (LOFIBRA) 134 MG capsule Take 160 mg by mouth daily.   1  . fluticasone (FLONASE) 50 MCG/ACT nasal spray Place 1 spray into both nostrils daily as needed.     Marland Kitchen glucose blood test strip One  touch verio, test once daily, Dx 250.00 100 each 3  . metFORMIN (GLUCOPHAGE) 500 MG tablet Take by mouth 2 (two) times daily with a meal.    . Omega-3 Fatty Acids (FISH OIL) 1200 MG CAPS Take 1 capsule by mouth daily.    . pantoprazole (PROTONIX) 40 MG tablet Take 1 tablet (40 mg total) by mouth daily before breakfast. 90 tablet 3   No current facility-administered medications for this visit.     Physical Exam:     BP 120/64   Pulse 68   Ht 5\' 3"  (1.6 m)   Wt 149 lb (67.6 kg)   BMI 26.39 kg/m   GENERAL:  Pleasant female in NAD PSYCH: : Cooperative, normal affect EENT:  conjunctiva pink, mucous membranes moist, neck supple without masses CARDIAC:  RRR, no peripheral edema PULM: Normal respiratory effort, lungs CTA bilaterally, no wheezing ABDOMEN:  Nondistended, soft, nontender. No obvious masses,  normal bowel sounds SKIN:  turgor, no lesions seen Musculoskeletal:  Normal muscle tone, normal strength NEURO: Alert and oriented x 3, no focal neurologic deficits   Tye Savoy , NP 07/05/2018, 8:47 AM

## 2018-08-17 DIAGNOSIS — Z0000 Encounter for general adult medical examination without abnormal findings: Secondary | ICD-10-CM | POA: Insufficient documentation

## 2018-08-17 DIAGNOSIS — E7849 Other hyperlipidemia: Secondary | ICD-10-CM | POA: Diagnosis not present

## 2018-08-17 DIAGNOSIS — E1169 Type 2 diabetes mellitus with other specified complication: Secondary | ICD-10-CM | POA: Diagnosis not present

## 2018-08-17 DIAGNOSIS — M859 Disorder of bone density and structure, unspecified: Secondary | ICD-10-CM | POA: Diagnosis not present

## 2018-08-17 DIAGNOSIS — E048 Other specified nontoxic goiter: Secondary | ICD-10-CM | POA: Diagnosis not present

## 2018-08-17 DIAGNOSIS — R82998 Other abnormal findings in urine: Secondary | ICD-10-CM | POA: Diagnosis not present

## 2018-08-24 DIAGNOSIS — E7849 Other hyperlipidemia: Secondary | ICD-10-CM | POA: Diagnosis not present

## 2018-08-24 DIAGNOSIS — N6019 Diffuse cystic mastopathy of unspecified breast: Secondary | ICD-10-CM | POA: Diagnosis not present

## 2018-08-24 DIAGNOSIS — Z1331 Encounter for screening for depression: Secondary | ICD-10-CM | POA: Diagnosis not present

## 2018-08-24 DIAGNOSIS — E1169 Type 2 diabetes mellitus with other specified complication: Secondary | ICD-10-CM | POA: Diagnosis not present

## 2018-08-24 DIAGNOSIS — I10 Essential (primary) hypertension: Secondary | ICD-10-CM | POA: Diagnosis not present

## 2018-08-24 DIAGNOSIS — R197 Diarrhea, unspecified: Secondary | ICD-10-CM | POA: Diagnosis not present

## 2018-08-24 DIAGNOSIS — K219 Gastro-esophageal reflux disease without esophagitis: Secondary | ICD-10-CM | POA: Diagnosis not present

## 2018-08-24 DIAGNOSIS — N200 Calculus of kidney: Secondary | ICD-10-CM | POA: Diagnosis not present

## 2018-08-24 DIAGNOSIS — E048 Other specified nontoxic goiter: Secondary | ICD-10-CM | POA: Diagnosis not present

## 2018-08-24 DIAGNOSIS — R9431 Abnormal electrocardiogram [ECG] [EKG]: Secondary | ICD-10-CM | POA: Diagnosis not present

## 2018-08-24 DIAGNOSIS — Z0000 Encounter for general adult medical examination without abnormal findings: Secondary | ICD-10-CM | POA: Diagnosis not present

## 2018-08-24 DIAGNOSIS — R209 Unspecified disturbances of skin sensation: Secondary | ICD-10-CM | POA: Diagnosis not present

## 2018-08-29 ENCOUNTER — Other Ambulatory Visit: Payer: Self-pay | Admitting: Endocrinology

## 2018-08-29 DIAGNOSIS — Z1231 Encounter for screening mammogram for malignant neoplasm of breast: Secondary | ICD-10-CM

## 2018-09-01 ENCOUNTER — Ambulatory Visit
Admission: RE | Admit: 2018-09-01 | Discharge: 2018-09-01 | Disposition: A | Discharge status: Home / Self Care | Payer: Medicare Other | Source: Ambulatory Visit | Attending: Endocrinology | Admitting: Endocrinology

## 2018-09-01 DIAGNOSIS — L570 Actinic keratosis: Secondary | ICD-10-CM | POA: Diagnosis not present

## 2018-09-01 DIAGNOSIS — L820 Inflamed seborrheic keratosis: Secondary | ICD-10-CM | POA: Diagnosis not present

## 2018-09-01 DIAGNOSIS — Z1231 Encounter for screening mammogram for malignant neoplasm of breast: Secondary | ICD-10-CM | POA: Diagnosis not present

## 2018-09-01 DIAGNOSIS — L812 Freckles: Secondary | ICD-10-CM | POA: Diagnosis not present

## 2018-09-01 DIAGNOSIS — L821 Other seborrheic keratosis: Secondary | ICD-10-CM | POA: Diagnosis not present

## 2018-09-01 DIAGNOSIS — D2271 Melanocytic nevi of right lower limb, including hip: Secondary | ICD-10-CM | POA: Diagnosis not present

## 2018-09-01 DIAGNOSIS — D2272 Melanocytic nevi of left lower limb, including hip: Secondary | ICD-10-CM | POA: Diagnosis not present

## 2018-09-01 DIAGNOSIS — D1801 Hemangioma of skin and subcutaneous tissue: Secondary | ICD-10-CM | POA: Diagnosis not present

## 2018-09-02 ENCOUNTER — Ambulatory Visit: Payer: Medicare Other

## 2018-11-22 DIAGNOSIS — N6019 Diffuse cystic mastopathy of unspecified breast: Secondary | ICD-10-CM | POA: Diagnosis not present

## 2018-11-22 DIAGNOSIS — R209 Unspecified disturbances of skin sensation: Secondary | ICD-10-CM | POA: Diagnosis not present

## 2018-11-22 DIAGNOSIS — M8580 Other specified disorders of bone density and structure, unspecified site: Secondary | ICD-10-CM | POA: Diagnosis not present

## 2018-11-22 DIAGNOSIS — E785 Hyperlipidemia, unspecified: Secondary | ICD-10-CM | POA: Diagnosis not present

## 2018-11-22 DIAGNOSIS — R197 Diarrhea, unspecified: Secondary | ICD-10-CM | POA: Diagnosis not present

## 2018-11-22 DIAGNOSIS — I10 Essential (primary) hypertension: Secondary | ICD-10-CM | POA: Diagnosis not present

## 2018-11-22 DIAGNOSIS — K219 Gastro-esophageal reflux disease without esophagitis: Secondary | ICD-10-CM | POA: Diagnosis not present

## 2018-11-22 DIAGNOSIS — E049 Nontoxic goiter, unspecified: Secondary | ICD-10-CM | POA: Diagnosis not present

## 2018-11-22 DIAGNOSIS — E1169 Type 2 diabetes mellitus with other specified complication: Secondary | ICD-10-CM | POA: Diagnosis not present

## 2018-11-22 DIAGNOSIS — K760 Fatty (change of) liver, not elsewhere classified: Secondary | ICD-10-CM | POA: Diagnosis not present

## 2018-11-22 DIAGNOSIS — N200 Calculus of kidney: Secondary | ICD-10-CM | POA: Diagnosis not present

## 2019-03-22 DIAGNOSIS — Z23 Encounter for immunization: Secondary | ICD-10-CM | POA: Diagnosis not present

## 2019-04-04 DIAGNOSIS — E119 Type 2 diabetes mellitus without complications: Secondary | ICD-10-CM | POA: Diagnosis not present

## 2019-04-04 DIAGNOSIS — H35033 Hypertensive retinopathy, bilateral: Secondary | ICD-10-CM | POA: Diagnosis not present

## 2019-04-11 DIAGNOSIS — M545 Low back pain, unspecified: Secondary | ICD-10-CM | POA: Insufficient documentation

## 2019-05-01 DIAGNOSIS — E1169 Type 2 diabetes mellitus with other specified complication: Secondary | ICD-10-CM | POA: Diagnosis not present

## 2019-05-04 DIAGNOSIS — N200 Calculus of kidney: Secondary | ICD-10-CM | POA: Diagnosis not present

## 2019-05-04 DIAGNOSIS — K760 Fatty (change of) liver, not elsewhere classified: Secondary | ICD-10-CM | POA: Diagnosis not present

## 2019-05-04 DIAGNOSIS — E1169 Type 2 diabetes mellitus with other specified complication: Secondary | ICD-10-CM | POA: Diagnosis not present

## 2019-05-04 DIAGNOSIS — I2510 Atherosclerotic heart disease of native coronary artery without angina pectoris: Secondary | ICD-10-CM | POA: Diagnosis not present

## 2019-05-04 DIAGNOSIS — E049 Nontoxic goiter, unspecified: Secondary | ICD-10-CM | POA: Diagnosis not present

## 2019-05-04 DIAGNOSIS — M8580 Other specified disorders of bone density and structure, unspecified site: Secondary | ICD-10-CM | POA: Diagnosis not present

## 2019-05-04 DIAGNOSIS — I10 Essential (primary) hypertension: Secondary | ICD-10-CM | POA: Diagnosis not present

## 2019-05-04 DIAGNOSIS — R197 Diarrhea, unspecified: Secondary | ICD-10-CM | POA: Diagnosis not present

## 2019-05-04 DIAGNOSIS — E785 Hyperlipidemia, unspecified: Secondary | ICD-10-CM | POA: Diagnosis not present

## 2019-05-04 DIAGNOSIS — N6019 Diffuse cystic mastopathy of unspecified breast: Secondary | ICD-10-CM | POA: Diagnosis not present

## 2019-08-04 ENCOUNTER — Other Ambulatory Visit: Payer: Self-pay | Admitting: Endocrinology

## 2019-08-04 DIAGNOSIS — Z1231 Encounter for screening mammogram for malignant neoplasm of breast: Secondary | ICD-10-CM

## 2019-08-25 DIAGNOSIS — E1149 Type 2 diabetes mellitus with other diabetic neurological complication: Secondary | ICD-10-CM | POA: Diagnosis not present

## 2019-08-25 DIAGNOSIS — M859 Disorder of bone density and structure, unspecified: Secondary | ICD-10-CM | POA: Diagnosis not present

## 2019-08-25 DIAGNOSIS — E1169 Type 2 diabetes mellitus with other specified complication: Secondary | ICD-10-CM | POA: Diagnosis not present

## 2019-08-25 DIAGNOSIS — E7849 Other hyperlipidemia: Secondary | ICD-10-CM | POA: Diagnosis not present

## 2019-08-25 DIAGNOSIS — E1142 Type 2 diabetes mellitus with diabetic polyneuropathy: Secondary | ICD-10-CM | POA: Diagnosis not present

## 2019-08-28 DIAGNOSIS — I10 Essential (primary) hypertension: Secondary | ICD-10-CM | POA: Diagnosis not present

## 2019-08-28 DIAGNOSIS — D1801 Hemangioma of skin and subcutaneous tissue: Secondary | ICD-10-CM | POA: Diagnosis not present

## 2019-08-28 DIAGNOSIS — L720 Epidermal cyst: Secondary | ICD-10-CM | POA: Diagnosis not present

## 2019-08-28 DIAGNOSIS — L812 Freckles: Secondary | ICD-10-CM | POA: Diagnosis not present

## 2019-08-28 DIAGNOSIS — L821 Other seborrheic keratosis: Secondary | ICD-10-CM | POA: Diagnosis not present

## 2019-08-28 DIAGNOSIS — R82998 Other abnormal findings in urine: Secondary | ICD-10-CM | POA: Diagnosis not present

## 2019-09-01 DIAGNOSIS — E1149 Type 2 diabetes mellitus with other diabetic neurological complication: Secondary | ICD-10-CM | POA: Diagnosis not present

## 2019-09-01 DIAGNOSIS — H35379 Puckering of macula, unspecified eye: Secondary | ICD-10-CM | POA: Diagnosis not present

## 2019-09-01 DIAGNOSIS — E049 Nontoxic goiter, unspecified: Secondary | ICD-10-CM | POA: Diagnosis not present

## 2019-09-01 DIAGNOSIS — I2510 Atherosclerotic heart disease of native coronary artery without angina pectoris: Secondary | ICD-10-CM | POA: Diagnosis not present

## 2019-09-01 DIAGNOSIS — Z1339 Encounter for screening examination for other mental health and behavioral disorders: Secondary | ICD-10-CM | POA: Diagnosis not present

## 2019-09-01 DIAGNOSIS — E1142 Type 2 diabetes mellitus with diabetic polyneuropathy: Secondary | ICD-10-CM | POA: Diagnosis not present

## 2019-09-01 DIAGNOSIS — E785 Hyperlipidemia, unspecified: Secondary | ICD-10-CM | POA: Diagnosis not present

## 2019-09-01 DIAGNOSIS — R197 Diarrhea, unspecified: Secondary | ICD-10-CM | POA: Diagnosis not present

## 2019-09-01 DIAGNOSIS — Z0000 Encounter for general adult medical examination without abnormal findings: Secondary | ICD-10-CM | POA: Diagnosis not present

## 2019-09-01 DIAGNOSIS — N6019 Diffuse cystic mastopathy of unspecified breast: Secondary | ICD-10-CM | POA: Diagnosis not present

## 2019-09-01 DIAGNOSIS — Z1331 Encounter for screening for depression: Secondary | ICD-10-CM | POA: Diagnosis not present

## 2019-09-01 DIAGNOSIS — M8580 Other specified disorders of bone density and structure, unspecified site: Secondary | ICD-10-CM | POA: Diagnosis not present

## 2019-09-01 DIAGNOSIS — I10 Essential (primary) hypertension: Secondary | ICD-10-CM | POA: Diagnosis not present

## 2019-09-01 DIAGNOSIS — K760 Fatty (change of) liver, not elsewhere classified: Secondary | ICD-10-CM | POA: Diagnosis not present

## 2019-09-05 DIAGNOSIS — Z1212 Encounter for screening for malignant neoplasm of rectum: Secondary | ICD-10-CM | POA: Diagnosis not present

## 2019-09-14 ENCOUNTER — Ambulatory Visit
Admission: RE | Admit: 2019-09-14 | Discharge: 2019-09-14 | Disposition: A | Discharge status: Home / Self Care | Payer: Medicare Other | Source: Ambulatory Visit | Attending: Endocrinology | Admitting: Endocrinology

## 2019-09-14 ENCOUNTER — Other Ambulatory Visit: Payer: Self-pay

## 2019-09-14 DIAGNOSIS — Z1231 Encounter for screening mammogram for malignant neoplasm of breast: Secondary | ICD-10-CM

## 2019-09-17 ENCOUNTER — Ambulatory Visit: Payer: Medicare Other

## 2020-02-05 DIAGNOSIS — E049 Nontoxic goiter, unspecified: Secondary | ICD-10-CM | POA: Diagnosis not present

## 2020-02-05 DIAGNOSIS — N200 Calculus of kidney: Secondary | ICD-10-CM | POA: Diagnosis not present

## 2020-02-05 DIAGNOSIS — N6019 Diffuse cystic mastopathy of unspecified breast: Secondary | ICD-10-CM | POA: Diagnosis not present

## 2020-02-05 DIAGNOSIS — E1149 Type 2 diabetes mellitus with other diabetic neurological complication: Secondary | ICD-10-CM | POA: Diagnosis not present

## 2020-02-05 DIAGNOSIS — E1142 Type 2 diabetes mellitus with diabetic polyneuropathy: Secondary | ICD-10-CM | POA: Diagnosis not present

## 2020-02-05 DIAGNOSIS — E7849 Other hyperlipidemia: Secondary | ICD-10-CM | POA: Diagnosis not present

## 2020-02-05 DIAGNOSIS — I2510 Atherosclerotic heart disease of native coronary artery without angina pectoris: Secondary | ICD-10-CM | POA: Diagnosis not present

## 2020-02-05 DIAGNOSIS — K219 Gastro-esophageal reflux disease without esophagitis: Secondary | ICD-10-CM | POA: Diagnosis not present

## 2020-02-05 DIAGNOSIS — I10 Essential (primary) hypertension: Secondary | ICD-10-CM | POA: Diagnosis not present

## 2020-02-05 DIAGNOSIS — K760 Fatty (change of) liver, not elsewhere classified: Secondary | ICD-10-CM | POA: Diagnosis not present

## 2020-04-20 DIAGNOSIS — Z23 Encounter for immunization: Secondary | ICD-10-CM | POA: Diagnosis not present

## 2020-05-28 DIAGNOSIS — E119 Type 2 diabetes mellitus without complications: Secondary | ICD-10-CM | POA: Diagnosis not present

## 2020-07-18 DIAGNOSIS — H00021 Hordeolum internum right upper eyelid: Secondary | ICD-10-CM | POA: Diagnosis not present

## 2020-08-26 ENCOUNTER — Other Ambulatory Visit: Payer: Self-pay | Admitting: Endocrinology

## 2020-08-26 DIAGNOSIS — Z1231 Encounter for screening mammogram for malignant neoplasm of breast: Secondary | ICD-10-CM

## 2020-08-29 DIAGNOSIS — Z20822 Contact with and (suspected) exposure to covid-19: Secondary | ICD-10-CM | POA: Diagnosis not present

## 2020-08-29 DIAGNOSIS — U071 COVID-19: Secondary | ICD-10-CM | POA: Diagnosis not present

## 2020-08-29 DIAGNOSIS — J069 Acute upper respiratory infection, unspecified: Secondary | ICD-10-CM | POA: Diagnosis not present

## 2020-09-16 DIAGNOSIS — E785 Hyperlipidemia, unspecified: Secondary | ICD-10-CM | POA: Diagnosis not present

## 2020-09-16 DIAGNOSIS — E1142 Type 2 diabetes mellitus with diabetic polyneuropathy: Secondary | ICD-10-CM | POA: Diagnosis not present

## 2020-09-16 DIAGNOSIS — E049 Nontoxic goiter, unspecified: Secondary | ICD-10-CM | POA: Diagnosis not present

## 2020-09-17 DIAGNOSIS — L814 Other melanin hyperpigmentation: Secondary | ICD-10-CM | POA: Diagnosis not present

## 2020-09-17 DIAGNOSIS — L821 Other seborrheic keratosis: Secondary | ICD-10-CM | POA: Diagnosis not present

## 2020-09-17 DIAGNOSIS — D1801 Hemangioma of skin and subcutaneous tissue: Secondary | ICD-10-CM | POA: Diagnosis not present

## 2020-09-18 DIAGNOSIS — E1142 Type 2 diabetes mellitus with diabetic polyneuropathy: Secondary | ICD-10-CM | POA: Diagnosis not present

## 2020-09-18 DIAGNOSIS — N200 Calculus of kidney: Secondary | ICD-10-CM | POA: Diagnosis not present

## 2020-09-18 DIAGNOSIS — I2510 Atherosclerotic heart disease of native coronary artery without angina pectoris: Secondary | ICD-10-CM | POA: Diagnosis not present

## 2020-09-18 DIAGNOSIS — E785 Hyperlipidemia, unspecified: Secondary | ICD-10-CM | POA: Diagnosis not present

## 2020-09-18 DIAGNOSIS — N210 Calculus in bladder: Secondary | ICD-10-CM | POA: Diagnosis not present

## 2020-09-18 DIAGNOSIS — R82998 Other abnormal findings in urine: Secondary | ICD-10-CM | POA: Diagnosis not present

## 2020-09-18 DIAGNOSIS — E049 Nontoxic goiter, unspecified: Secondary | ICD-10-CM | POA: Diagnosis not present

## 2020-09-18 DIAGNOSIS — I10 Essential (primary) hypertension: Secondary | ICD-10-CM | POA: Diagnosis not present

## 2020-09-18 DIAGNOSIS — N6019 Diffuse cystic mastopathy of unspecified breast: Secondary | ICD-10-CM | POA: Diagnosis not present

## 2020-09-18 DIAGNOSIS — Z1212 Encounter for screening for malignant neoplasm of rectum: Secondary | ICD-10-CM | POA: Diagnosis not present

## 2020-09-18 DIAGNOSIS — Z0000 Encounter for general adult medical examination without abnormal findings: Secondary | ICD-10-CM | POA: Diagnosis not present

## 2020-09-18 DIAGNOSIS — M8580 Other specified disorders of bone density and structure, unspecified site: Secondary | ICD-10-CM | POA: Diagnosis not present

## 2020-09-25 DIAGNOSIS — Z1231 Encounter for screening mammogram for malignant neoplasm of breast: Secondary | ICD-10-CM

## 2020-09-27 ENCOUNTER — Other Ambulatory Visit: Payer: Self-pay

## 2020-09-27 ENCOUNTER — Ambulatory Visit
Admission: RE | Admit: 2020-09-27 | Discharge: 2020-09-27 | Disposition: A | Discharge status: Home / Self Care | Payer: Medicare Other | Source: Ambulatory Visit | Attending: Endocrinology | Admitting: Endocrinology

## 2020-09-27 DIAGNOSIS — Z1231 Encounter for screening mammogram for malignant neoplasm of breast: Secondary | ICD-10-CM

## 2020-10-31 ENCOUNTER — Other Ambulatory Visit: Payer: Self-pay

## 2020-10-31 ENCOUNTER — Ambulatory Visit (INDEPENDENT_AMBULATORY_CARE_PROVIDER_SITE_OTHER): Payer: Medicare Other

## 2020-10-31 ENCOUNTER — Ambulatory Visit (INDEPENDENT_AMBULATORY_CARE_PROVIDER_SITE_OTHER): Payer: Medicare Other | Admitting: Podiatry

## 2020-10-31 DIAGNOSIS — M79676 Pain in unspecified toe(s): Secondary | ICD-10-CM | POA: Diagnosis not present

## 2020-10-31 DIAGNOSIS — M722 Plantar fascial fibromatosis: Secondary | ICD-10-CM

## 2020-10-31 DIAGNOSIS — L600 Ingrowing nail: Secondary | ICD-10-CM | POA: Diagnosis not present

## 2020-10-31 MED ORDER — BETAMETHASONE SOD PHOS & ACET 6 (3-3) MG/ML IJ SUSP
6.0000 mg | Freq: Once | INTRAMUSCULAR | Status: AC
  Administered 2020-10-31: 6 mg

## 2020-10-31 NOTE — Patient Instructions (Signed)

## 2020-10-31 NOTE — Progress Notes (Signed)
Subjective:  Patient ID: Michelle Spears, female    DOB: Dec 14, 1945,  MRN: [id number]  Chief Complaint  Patient presents with  . Pain    Rt bottom heel pain x 3 mo; had it 5 yrs ago too - no injury - no swelling - worse after resting/1st step - 7/10 sharp pains -Tx: stretching and epsom sakt  . Nail Problem    Rt hallux nail problem at medial border x couple years    75 y.o. female presents with the above complaint. History confirmed with patient.   Objective:  Physical Exam: warm, good capillary refill, no trophic changes or ulcerative lesions, normal DP and PT pulses and normal sensory exam. Right Foot: tenderness to palpation medial calcaneal tuber, no pain with calcaneal squeeze, decreased ankle joint ROM and +Silverskiold test. Ingrowing nail right medial border normal exam, no swelling, tenderness, instability; ligaments intact, full range of motion of all ankle/foot joints other than findings noted above.  Radiographs: X-ray of the right foot: no evidence of calcaneal stress fracture, plantar calcaneal spur, posterior calcaneal spur and Haglund deformity noted  Assessment:   1. Plantar fasciitis of right foot   2. Ingrown nail   3. Pain around toenail      Plan:  Patient was evaluated and treated and all questions answered.  Plantar Fasciitis -XR reviewed with patient -Educated patient on stretching and icing of the affected limb -Plantar fascial brace dispensed -Injection delivered to the plantar fascia of the right foot.  Procedure: Injection Tendon/Ligament Consent: Verbal consent obtained. Location: Right plantar fascia at the glabrous junction; medial approach. Skin Prep: Alcohol. Injectate: 1 cc 0.5% marcaine plain, 1 cc betamethasone acetate-betamethasone sodium phosphate Disposition: Patient tolerated procedure well. Injection site dressed with a band-aid.  Ingrown Nail -Debrided in slant back fashion to patient relief.   Return in about 4 weeks (around  11/28/2020) for Plantar fasciitis.

## 2020-11-28 ENCOUNTER — Encounter: Payer: Self-pay | Admitting: Podiatry

## 2020-11-28 ENCOUNTER — Ambulatory Visit (INDEPENDENT_AMBULATORY_CARE_PROVIDER_SITE_OTHER): Payer: Medicare Other | Admitting: Podiatry

## 2020-11-28 ENCOUNTER — Other Ambulatory Visit: Payer: Self-pay

## 2020-11-28 DIAGNOSIS — M722 Plantar fascial fibromatosis: Secondary | ICD-10-CM

## 2020-11-28 DIAGNOSIS — M216X9 Other acquired deformities of unspecified foot: Secondary | ICD-10-CM

## 2020-11-28 NOTE — Progress Notes (Signed)
Subjective:  Patient ID: Michelle Spears, female    DOB: May 06, 1946,  MRN: [id number]  Chief Complaint  Patient presents with  . Plantar Fasciitis    It hurts to put all the weight on the front of the foot and the shot did not really help made worse and hurts with exercises and throbs at the end of the day on the right foot   75 y.o. female presents with the above complaint. History confirmed with patient.   Objective:  Physical Exam: warm, good capillary refill, no trophic changes or ulcerative lesions, normal DP and PT pulses and normal sensory exam. Right Foot: tenderness to palpation medial calcaneal tuber, no pain with calcaneal squeeze, decreased ankle joint ROM and +Silverskiold test. Ingrowing nail right medial border normal exam, no swelling, tenderness, instability; ligaments intact, full range of motion of all ankle/foot joints other than findings noted above.    Assessment:   1. Plantar fasciitis of right foot   2. Equinus deformity of foot    Plan:  Patient was evaluated and treated and all questions answered.  Plantar Fasciitis -Continue stretching and icing. -PF taping applied -Night splint dispensed.  Ingrown Nail -Debrided in slant back fashion to patient relief.   No follow-ups on file.

## 2020-12-26 ENCOUNTER — Ambulatory Visit: Payer: Medicare Other | Admitting: Podiatry

## 2021-01-16 DIAGNOSIS — M8580 Other specified disorders of bone density and structure, unspecified site: Secondary | ICD-10-CM | POA: Diagnosis not present

## 2021-01-16 DIAGNOSIS — E785 Hyperlipidemia, unspecified: Secondary | ICD-10-CM | POA: Diagnosis not present

## 2021-01-16 DIAGNOSIS — M722 Plantar fascial fibromatosis: Secondary | ICD-10-CM | POA: Diagnosis not present

## 2021-01-16 DIAGNOSIS — I10 Essential (primary) hypertension: Secondary | ICD-10-CM | POA: Diagnosis not present

## 2021-01-16 DIAGNOSIS — E1142 Type 2 diabetes mellitus with diabetic polyneuropathy: Secondary | ICD-10-CM | POA: Diagnosis not present

## 2021-01-16 DIAGNOSIS — I2510 Atherosclerotic heart disease of native coronary artery without angina pectoris: Secondary | ICD-10-CM | POA: Diagnosis not present

## 2021-01-16 DIAGNOSIS — N6019 Diffuse cystic mastopathy of unspecified breast: Secondary | ICD-10-CM | POA: Diagnosis not present

## 2021-02-03 ENCOUNTER — Ambulatory Visit (INDEPENDENT_AMBULATORY_CARE_PROVIDER_SITE_OTHER): Payer: Medicare Other | Admitting: Podiatry

## 2021-02-03 ENCOUNTER — Encounter: Payer: Self-pay | Admitting: Podiatry

## 2021-02-03 ENCOUNTER — Other Ambulatory Visit: Payer: Self-pay

## 2021-02-03 DIAGNOSIS — M722 Plantar fascial fibromatosis: Secondary | ICD-10-CM | POA: Diagnosis not present

## 2021-02-03 DIAGNOSIS — L600 Ingrowing nail: Secondary | ICD-10-CM | POA: Diagnosis not present

## 2021-02-03 NOTE — Patient Instructions (Signed)

## 2021-02-04 NOTE — Progress Notes (Signed)
Subjective:   Patient ID: Michelle Spears, female   DOB: 75 y.o.   MRN: [id number]   HPI Patient presents stating she has had a chronic ingrown toenail deformity right which does not seem to be responding to previous treatments and has very painful right heel that has remained tender over the last few months despite treatment.  States it feels like she is walking on nails and it hard to bear weight   ROS      Objective:  Physical Exam  Neurovascular status intact with exquisite discomfort plantar aspect right heel at the insertional point of the tendon into the calcaneus with inflammation fluid buildup noted with patient noted to have incurvated medial border of the right hallux painful when pressed slight redness no active drainage or proximal edema erythema drainage noted     Assessment:  Acute Planter fasciitis right with inflammation pain along with ingrown toenail deformity right hallux medial border     Plan:  H&P reviewed conditions and due to the intensity of the plantar discomfort I did reinject the plantar fascial right 3 mg Kenalog 5 mg Xylocaine and I then went ahead and applied a air fracture walker to completely immobilize the foot and take all stress off the plantar heel.  I discussed ingrown toenail correction she wants this done due to the chronic pain and she had had the left 1 done by me years ago and I went ahead today explained procedure risk and she signed consent form and I infiltrated the right hallux 60 mg like Marcaine mixture sterile prep done and using sterile instrumentation remove the medial border exposed matrix applied phenol 3 applications 30 seconds followed by alcohol lavage sterile dressing and gave instructions on soaks and encouraged her to call if any issues were to occur or take off dressing early if any throbbing were to occur

## 2021-02-24 DIAGNOSIS — N200 Calculus of kidney: Secondary | ICD-10-CM | POA: Diagnosis not present

## 2021-03-03 ENCOUNTER — Other Ambulatory Visit: Payer: Self-pay

## 2021-03-03 ENCOUNTER — Encounter: Payer: Self-pay | Admitting: Podiatry

## 2021-03-03 ENCOUNTER — Ambulatory Visit (INDEPENDENT_AMBULATORY_CARE_PROVIDER_SITE_OTHER): Payer: Medicare Other | Admitting: Podiatry

## 2021-03-03 DIAGNOSIS — M216X9 Other acquired deformities of unspecified foot: Secondary | ICD-10-CM

## 2021-03-03 DIAGNOSIS — M722 Plantar fascial fibromatosis: Secondary | ICD-10-CM

## 2021-03-03 DIAGNOSIS — L600 Ingrowing nail: Secondary | ICD-10-CM

## 2021-03-03 DIAGNOSIS — R252 Cramp and spasm: Secondary | ICD-10-CM | POA: Diagnosis not present

## 2021-03-03 NOTE — Progress Notes (Signed)
Subjective:   Patient ID: Michelle Spears, female   DOB: 75 y.o.   MRN: [id number]   HPI Patient presents with several different problems with significant discomfort plantar aspect right heel and ingrown toenail that was removed that is crusted over   ROS      Objective:  Physical Exam  Neurovascular status intact with significant diminishment of discomfort right plantar heel with pain still present upon deep palpation but better than previous with ingrown toenail right that is also healed well     Assessment:  Doing well post plantar fascial treatment and also ingrown toenail with patient also having concerns about chronic cramping of her feet     Plan:  H&P reviewed all different and discussed conditions concerning heel nail and cramps.  For the heel I recommended continuation of stretching exercises anti-inflammatories physical therapy and for the nail I do not recommend current treatment as it does appear to be healthy and patient will be seen back as needed.  Cramps we will try the magnesium and quinine to see if this will resolve symptoms

## 2021-03-05 DIAGNOSIS — R0781 Pleurodynia: Secondary | ICD-10-CM | POA: Diagnosis not present

## 2021-03-12 ENCOUNTER — Ambulatory Visit: Payer: 59 | Admitting: Surgery

## 2021-04-16 DIAGNOSIS — R0781 Pleurodynia: Secondary | ICD-10-CM | POA: Diagnosis not present

## 2021-04-29 DIAGNOSIS — Z23 Encounter for immunization: Secondary | ICD-10-CM | POA: Diagnosis not present

## 2021-05-20 DIAGNOSIS — E785 Hyperlipidemia, unspecified: Secondary | ICD-10-CM | POA: Diagnosis not present

## 2021-05-20 DIAGNOSIS — M722 Plantar fascial fibromatosis: Secondary | ICD-10-CM | POA: Diagnosis not present

## 2021-05-20 DIAGNOSIS — I10 Essential (primary) hypertension: Secondary | ICD-10-CM | POA: Diagnosis not present

## 2021-05-20 DIAGNOSIS — M8580 Other specified disorders of bone density and structure, unspecified site: Secondary | ICD-10-CM | POA: Diagnosis not present

## 2021-05-20 DIAGNOSIS — E1142 Type 2 diabetes mellitus with diabetic polyneuropathy: Secondary | ICD-10-CM | POA: Diagnosis not present

## 2021-05-20 DIAGNOSIS — N6019 Diffuse cystic mastopathy of unspecified breast: Secondary | ICD-10-CM | POA: Diagnosis not present

## 2021-05-20 DIAGNOSIS — I2510 Atherosclerotic heart disease of native coronary artery without angina pectoris: Secondary | ICD-10-CM | POA: Diagnosis not present

## 2021-06-02 DIAGNOSIS — M1812 Unilateral primary osteoarthritis of first carpometacarpal joint, left hand: Secondary | ICD-10-CM | POA: Diagnosis not present

## 2021-07-23 DIAGNOSIS — M180 Bilateral primary osteoarthritis of first carpometacarpal joints: Secondary | ICD-10-CM | POA: Diagnosis not present

## 2021-08-07 DIAGNOSIS — H524 Presbyopia: Secondary | ICD-10-CM | POA: Diagnosis not present

## 2021-08-22 DIAGNOSIS — S139XXA Sprain of joints and ligaments of unspecified parts of neck, initial encounter: Secondary | ICD-10-CM | POA: Diagnosis not present

## 2021-08-22 DIAGNOSIS — W19XXXA Unspecified fall, initial encounter: Secondary | ICD-10-CM | POA: Diagnosis not present

## 2021-08-22 DIAGNOSIS — S2341XA Sprain of ribs, initial encounter: Secondary | ICD-10-CM | POA: Diagnosis not present

## 2021-08-22 DIAGNOSIS — S0990XA Unspecified injury of head, initial encounter: Secondary | ICD-10-CM | POA: Diagnosis not present

## 2021-08-22 DIAGNOSIS — S6992XA Unspecified injury of left wrist, hand and finger(s), initial encounter: Secondary | ICD-10-CM | POA: Diagnosis not present

## 2021-08-22 DIAGNOSIS — S6991XA Unspecified injury of right wrist, hand and finger(s), initial encounter: Secondary | ICD-10-CM | POA: Diagnosis not present

## 2021-08-28 DIAGNOSIS — Z0100 Encounter for examination of eyes and vision without abnormal findings: Secondary | ICD-10-CM | POA: Diagnosis not present

## 2021-09-10 DIAGNOSIS — E119 Type 2 diabetes mellitus without complications: Secondary | ICD-10-CM | POA: Diagnosis not present

## 2021-09-17 DIAGNOSIS — L565 Disseminated superficial actinic porokeratosis (DSAP): Secondary | ICD-10-CM | POA: Diagnosis not present

## 2021-09-17 DIAGNOSIS — D1801 Hemangioma of skin and subcutaneous tissue: Secondary | ICD-10-CM | POA: Diagnosis not present

## 2021-09-17 DIAGNOSIS — L821 Other seborrheic keratosis: Secondary | ICD-10-CM | POA: Diagnosis not present

## 2021-09-17 DIAGNOSIS — L812 Freckles: Secondary | ICD-10-CM | POA: Diagnosis not present

## 2021-09-24 ENCOUNTER — Other Ambulatory Visit: Payer: Self-pay | Admitting: Endocrinology

## 2021-09-24 DIAGNOSIS — Z1231 Encounter for screening mammogram for malignant neoplasm of breast: Secondary | ICD-10-CM

## 2021-09-29 ENCOUNTER — Ambulatory Visit
Admission: RE | Admit: 2021-09-29 | Discharge: 2021-09-29 | Disposition: A | Discharge status: Home / Self Care | Payer: Medicare HMO | Source: Ambulatory Visit | Attending: Endocrinology | Admitting: Endocrinology

## 2021-09-29 DIAGNOSIS — I10 Essential (primary) hypertension: Secondary | ICD-10-CM | POA: Diagnosis not present

## 2021-09-29 DIAGNOSIS — Z1231 Encounter for screening mammogram for malignant neoplasm of breast: Secondary | ICD-10-CM | POA: Diagnosis not present

## 2021-09-29 DIAGNOSIS — E049 Nontoxic goiter, unspecified: Secondary | ICD-10-CM | POA: Diagnosis not present

## 2021-09-29 DIAGNOSIS — E785 Hyperlipidemia, unspecified: Secondary | ICD-10-CM | POA: Diagnosis not present

## 2021-09-29 DIAGNOSIS — E1142 Type 2 diabetes mellitus with diabetic polyneuropathy: Secondary | ICD-10-CM | POA: Diagnosis not present

## 2021-10-10 DIAGNOSIS — E1142 Type 2 diabetes mellitus with diabetic polyneuropathy: Secondary | ICD-10-CM | POA: Diagnosis not present

## 2021-10-10 DIAGNOSIS — I10 Essential (primary) hypertension: Secondary | ICD-10-CM | POA: Diagnosis not present

## 2021-10-10 DIAGNOSIS — M545 Low back pain, unspecified: Secondary | ICD-10-CM | POA: Diagnosis not present

## 2021-10-10 DIAGNOSIS — Z1212 Encounter for screening for malignant neoplasm of rectum: Secondary | ICD-10-CM | POA: Diagnosis not present

## 2021-10-10 DIAGNOSIS — R82998 Other abnormal findings in urine: Secondary | ICD-10-CM | POA: Diagnosis not present

## 2021-10-10 DIAGNOSIS — Z0000 Encounter for general adult medical examination without abnormal findings: Secondary | ICD-10-CM | POA: Diagnosis not present

## 2021-10-10 DIAGNOSIS — I2510 Atherosclerotic heart disease of native coronary artery without angina pectoris: Secondary | ICD-10-CM | POA: Diagnosis not present

## 2021-10-10 DIAGNOSIS — M722 Plantar fascial fibromatosis: Secondary | ICD-10-CM | POA: Diagnosis not present

## 2021-10-10 DIAGNOSIS — E785 Hyperlipidemia, unspecified: Secondary | ICD-10-CM | POA: Diagnosis not present

## 2021-10-10 DIAGNOSIS — E049 Nontoxic goiter, unspecified: Secondary | ICD-10-CM | POA: Diagnosis not present

## 2021-10-10 DIAGNOSIS — M8580 Other specified disorders of bone density and structure, unspecified site: Secondary | ICD-10-CM | POA: Diagnosis not present

## 2021-10-30 ENCOUNTER — Ambulatory Visit: Payer: Medicare HMO | Admitting: Orthopaedic Surgery

## 2021-10-30 ENCOUNTER — Encounter: Payer: Self-pay | Admitting: Orthopaedic Surgery

## 2021-10-30 ENCOUNTER — Ambulatory Visit (INDEPENDENT_AMBULATORY_CARE_PROVIDER_SITE_OTHER): Payer: Medicare HMO

## 2021-10-30 VITALS — Ht 63.0 in | Wt 150.0 lb

## 2021-10-30 DIAGNOSIS — M545 Low back pain, unspecified: Secondary | ICD-10-CM

## 2021-10-30 DIAGNOSIS — M25552 Pain in left hip: Secondary | ICD-10-CM

## 2021-10-30 DIAGNOSIS — G8929 Other chronic pain: Secondary | ICD-10-CM

## 2021-10-30 NOTE — Progress Notes (Signed)
? ?Office Visit Note ?  ?Patient: Michelle Spears           ?Date of Birth: [birth year]           ?MRN: [id number] ?Visit Date: 10/30/2021 ?             ?Requested by: [name], MD ?[address] ?[place],  [place] [address] ?PCP: [name], MD ? ? ?Assessment & Plan: ?Visit Diagnoses:  ?1. Pain in left hip   ?2. Chronic left-sided low back pain without sciatica   ? ? ?Plan: Michelle Spears is accompanied by her husband and seen for evaluation of low back pain with referred discomfort into her left thigh and left hip.  She has had problem for "a while and finds it difficult to sit for any length of time.  She seems to be fine when she is up and walking.  She is not having any referred pain to either lower extremity but does have a remote history of some neuropathy.  She notes that the pain oftentimes can be compromising.  She is not having much trouble on the right.  She has not had any bowel or bladder changes.  Films revealed a significant degenerative changes in the lumbar spine.  She certainly could have referred pain from the facet joints or possibly even some mild stenosis.  I think an MRI scan is worthwhile.  She has had trouble off and on for many years and might be a candidate for lumbar injection either ESI or facet joint.  In the meantime we will also try a course of physical therapy.  She will continue with over-the-counter medicines as needed.  I did not see any significant pathology of either hip ? ?Follow-Up Instructions: Return After MRI scan lumbar spine.  ? ?Orders:  ?Orders Placed This Encounter  ?Procedures  ? XR HIP UNILAT W OR W/O PELVIS 2-3 VIEWS LEFT  ? XR Lumbar Spine 2-3 Views  ? ?No orders of the defined types were placed in this encounter. ? ? ? ? Procedures: ?No procedures performed ? ? ?Clinical Data: ?No additional findings. ? ? ?Subjective: ?Chief Complaint  ?Patient presents with  ? Left Hip - Pain  ?Patient presents today for left hip pain. She said that she has been having this  pain in her groin area for over a month. When sitting she has pain in her buttock, and does have some lower back pain as well. She remembers falling in her bathroom two months ago and became sore afterwards. She said that her pain wakes her at night. She has been using ice, heat, and over the counter pain medicine as needed.  ?Has had a chronic history of recurrent low back pain.  She did sustained a compression fracture of what looks like T12 years ago and did see Michelle Spears.  But also notes that she has had episodes of of low back pain over the years.  She denies bowel or bladder changes ? ?HPI ? ?Review of Systems ? ? ?Objective: ?Vital Signs: Ht 5\' 3"  (1.6 m)   Wt 150 lb (68 kg)   BMI 26.57 kg/m?  ? ?Physical Exam ?Constitutional:   ?   Appearance: She is well-developed.  ?Pulmonary:  ?   Effort: Pulmonary effort is normal.  ?Skin: ?   General: Skin is warm and dry.  ?Neurological:  ?   Mental Status: She is alert and oriented to person, place, and time.  ?Psychiatric:     ?  Behavior: Behavior normal.  ? ? ?Ortho Exam awake alert and oriented x3.  Comfortable sitting.  No obvious distress and did not use any ambulatory aid.  Was able to walk within erect stance.  No percussible tenderness lumbar spine or over the either flank.  A little bit of discomfort in her back with straight leg raise on the left.  Painless range of motion both hips with internal/external rotation.  She relates that she has normal sensation in both of her feet and appeared to have good motor exam.  She has had on occasion some "funny feeling in her feet". ? ?Specialty Comments:  ?No specialty comments available. ? ?Imaging: ?XR HIP UNILAT W OR W/O PELVIS 2-3 VIEWS LEFT ? ?Result Date: 10/30/2021 ?AP pelvis and lateral left hip are obtained demonstrating very minimal degenerative change.  There was some calcification of the labrum but the joint spaces appear to be relatively well-maintained.  There is decreased bone density.  I did not see  any appreciable subchondral sclerosis or subchondral cysts in the femoral heads or the acetabulum.  I believe her problem is related to her back rather than her hips ? ?XR Lumbar Spine 2-3 Views ? ?Result Date: 10/30/2021 ?Films lumbar spine obtained in 2 projections.  There are obvious degenerative changes at the lower lumbar spine at L3-4 L4-5 and L5-S1.  There is very slight anterior listhesis of L3 on 4 and probably a grade 1 anterior listhesis of L4 on 5.  Disc spaces are narrowed at all 3 levels.  There is a degenerative scoliosis to the left centered about L3.  There is diffuse calcification abdominal aorta but without obvious aneurysmal dilatation.  Appears to have an old compression fracture of T12  ? ? ?PMFS History: ?Patient Active Problem List  ? Diagnosis Date Noted  ? Epiretinal membrane 09/01/2019  ? Polyneuropathy due to type 2 diabetes mellitus (HCC) 09/01/2019  ? Atherosclerotic heart disease of native coronary artery without angina pectoris 05/04/2019  ? Low back pain 04/11/2019  ? Encounter for general adult medical examination without abnormal findings 08/17/2018  ? Disorder of bone 02/24/2018  ? Fibrocystic breast changes 02/24/2018  ? Kidney stone 02/24/2018  ? 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 Hepatic steatosis 03/15/2017  ? Hypertriglyceridemia 12/13/2016  ? Chest pain with low risk for cardiac etiology 12/11/2016  ? Abnormal electrocardiogram (ECG) (EKG) 12/11/2016  ? Family history of premature coronary artery disease 12/11/2016  ? Pruritic dermatitis 08/01/2015  ? Other allergic rhinitis 08/01/2015  ? Osteopenia 09/20/2014  ? Cystocele 09/20/2014  ? 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 Prediabetes 12/13/2013  ? Loose stools 09/28/2013  ? Hepatic cyst 09/28/2013  ? Hyperglycemia 05/19/2013  ? Vaginitis, atrophic 04/07/2012  ? Essential hypertension 03/14/2007  ? GERD 03/14/2007  ? HEADACHE 03/14/2007  ? ?Past Medical History:  ?Diagnosis Date  ? Diverticulosis 2007  ? Essential  hypertension   ? GERD (gastroesophageal reflux disease)   ? Glucose intolerance   ? /Prediabetes -> most recent hemoglobin A1c was 6.6  ? Headache(784.0)   ? Hepatic cyst   ? History of hysterectomy   ? nonmalignant reasons  ? Hypertriglyceridemia   ? Internal hemorrhoids   ? Kidney stone 1975/2004  ? PMS (premenstrual syndrome)   ? Seasonal allergies   ? Spastic colon   ? Thyroid goiter   ? hx of  ?  ?Family History  ?Problem Relation Age of Onset  ? Hyperlipidemia Mother   ?     Has chronic  back pain  ? Obesity Father   ? Heart attack Father 7  ?     Died from "massive MI "  ? Hypertension Father   ? Hyperlipidemia Other   ?     siblings  ? CAD Other   ?     Several paternal uncles and aunts all with CAD before age 73  ? Multiple sclerosis Sister   ? Diabetes Mellitus II Brother   ? Heart attack Paternal Grandfather 50  ? High Cholesterol Brother   ?  ?Past Surgical History:  ?Procedure Laterality Date  ? ABDOMINAL HYSTERECTOMY    ? bladder stent    ? for stones  ? bladder stent removal    ? BREAST BIOPSY Right   ? 2018  ? childbirthx2    ? COLONOSCOPY  2013  ? GALLBLADDER SURGERY  12/2014  ? KIDNEY STONE SURGERY  1975  ? TEMPOROMANDIBULAR JOINT SURGERY  1995  ? TONSILLECTOMY    ? TOTAL ABDOMINAL HYSTERECTOMY W/ BILATERAL SALPINGOOPHORECTOMY  1985  ? WRIST SURGERY  2017  ? ?Social History  ? ?Occupational History  ? Occupation: retired - Physiological scientist  ?  Comment: [employer]  ?Tobacco Use  ? Smoking status: Former  ?  Packs/day: 0.30  ?  Years: 12.00  ?  Pack years: 3.60  ?  Types: Cigarettes  ?  Quit date: 07/20/2004  ?  Years since quitting: 17.2  ? Smokeless tobacco: Never  ?Substance and Sexual Activity  ? Alcohol use: Yes  ?  Alcohol/week: 0.0 standard drinks  ?  Comment: occ.   ? Drug use: No  ? Sexual activity: Not on file  ? ? ? ? ? ? ?

## 2021-10-30 NOTE — Addendum Note (Signed)
Addended by: Lendon Collar on: 10/30/2021 03:50 PM ? ? Modules accepted: Orders ? ?

## 2021-11-08 ENCOUNTER — Ambulatory Visit
Admission: RE | Admit: 2021-11-08 | Discharge: 2021-11-08 | Disposition: A | Discharge status: Home / Self Care | Payer: Medicare HMO | Source: Ambulatory Visit | Attending: Orthopaedic Surgery | Admitting: Orthopaedic Surgery

## 2021-11-08 DIAGNOSIS — M48061 Spinal stenosis, lumbar region without neurogenic claudication: Secondary | ICD-10-CM | POA: Diagnosis not present

## 2021-11-08 DIAGNOSIS — M545 Low back pain, unspecified: Secondary | ICD-10-CM | POA: Diagnosis not present

## 2021-11-08 DIAGNOSIS — G8929 Other chronic pain: Secondary | ICD-10-CM

## 2021-11-13 ENCOUNTER — Encounter: Payer: Self-pay | Admitting: Orthopaedic Surgery

## 2021-11-13 ENCOUNTER — Ambulatory Visit: Payer: Medicare HMO | Admitting: Orthopaedic Surgery

## 2021-11-13 DIAGNOSIS — G8929 Other chronic pain: Secondary | ICD-10-CM | POA: Diagnosis not present

## 2021-11-13 DIAGNOSIS — M545 Low back pain, unspecified: Secondary | ICD-10-CM

## 2021-11-13 NOTE — Progress Notes (Signed)
? ?Office Visit Note ?  ?Patient: Michelle Spears           ?Date of Birth: 1946-01-30           ?MRN: [id number] ?Visit Date: 11/13/2021 ?             ?Requested by: [name], MD ?[address] ?[place],  [place] [address] ?PCP: [name], MD ? ? ?Assessment & Plan: ?Visit Diagnoses:  ?1. Chronic left-sided low back pain without sciatica   ? ? ?Plan: Mrs. Zegarelli has had chronic problem with her back as outlined in her previous notes I ordered an MRI scan and she is here for the results.  The scan did demonstrate advanced multilevel degenerative disc disease at multiple levels in the lumbar spine there were some levels of neuroforaminal stenosis more on the left than the right and that may account for some of the discomfort she is experiencing in her left groin.  She does have trouble sleeping and working in the yard.  We have had a long discussion regarding the above.  We have previously sent a request for her to be evaluated at [facility] regimen but she has yet to hear from them so we will resubmit that.  She is generally can try Tylenol.  She does have a back support at home and can try heat.  I think over time to therapy and exercise will make a big difference.  I also think that if she is not much better over the next 4 to 6 weeks that she might be a good candidate for an epidural steroid injection and asked her to call the office ? ?Follow-Up Instructions: Return if symptoms worsen or fail to improve.  ? ?Orders:  ?No orders of the defined types were placed in this encounter. ? ?No orders of the defined types were placed in this encounter. ? ? ? ? Procedures: ?No procedures performed ? ? ?Clinical Data: ?No additional findings. ? ? ?Subjective: ?Chief Complaint  ?Patient presents with  ? Lower Back - Pain  ?Mrs. Lemmo returns for evaluation of her low back pain.  She has had a recent MRI scan and is here for the results ? ?HPI ? ?Review of Systems ? ? ?Objective: ?Vital Signs: There were no  vitals taken for this visit. ? ?Physical Exam ?Constitutional:   ?   Appearance: She is well-developed.  ?Eyes:  ?   Pupils: Pupils are equal, round, and reactive to light.  ?Pulmonary:  ?   Effort: Pulmonary effort is normal.  ?Skin: ?   General: Skin is warm and dry.  ?Neurological:  ?   Mental Status: She is alert and oriented to person, place, and time.  ?Psychiatric:     ?   Behavior: Behavior normal.  ? ? ?Ortho Exam awake alert and oriented x3.  Comfortable sitting and in no acute distress.  Straight leg raise negative.  She was able to easily get up and down from a sitting position she did note that her back was a little bit stiff.  Motor exam appears to be intact.  Painless range of motion both hips.  She has very minimal percussible tenderness along the lumbosacral junction to the left ? ?Specialty Comments:  ?No specialty comments available. ? ?Imaging: ?No results found. ? ? ?PMFS History: ?Patient Active Problem List  ? Diagnosis Date Noted  ? Epiretinal membrane 09/01/2019  ? Polyneuropathy due to type 2 diabetes mellitus (HCC) 09/01/2019  ? Atherosclerotic  heart disease of native coronary artery without angina pectoris 05/04/2019  ? Low back pain 04/11/2019  ? Encounter for general adult medical examination without abnormal findings 08/17/2018  ? Disorder of bone 02/24/2018  ? Fibrocystic breast changes 02/24/2018  ? Kidney stone 02/24/2018  ? 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 Hepatic steatosis 03/15/2017  ? Hypertriglyceridemia 12/13/2016  ? Chest pain with low risk for cardiac etiology 12/11/2016  ? Abnormal electrocardiogram (ECG) (EKG) 12/11/2016  ? Family history of premature coronary artery disease 12/11/2016  ? Pruritic dermatitis 08/01/2015  ? Other allergic rhinitis 08/01/2015  ? Osteopenia 09/20/2014  ? Cystocele 09/20/2014  ? 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 Prediabetes 12/13/2013  ? Loose stools 09/28/2013  ? Hepatic cyst 09/28/2013  ? Hyperglycemia 05/19/2013  ? Vaginitis,  atrophic 04/07/2012  ? Essential hypertension 03/14/2007  ? GERD 03/14/2007  ? HEADACHE 03/14/2007  ? ?Past Medical History:  ?Diagnosis Date  ? Diverticulosis 2007  ? Essential hypertension   ? GERD (gastroesophageal reflux disease)   ? Glucose intolerance   ? /Prediabetes -> most recent hemoglobin A1c was 6.6  ? Headache(784.0)   ? Hepatic cyst   ? History of hysterectomy   ? nonmalignant reasons  ? Hypertriglyceridemia   ? Internal hemorrhoids   ? Kidney stone 1975/2004  ? PMS (premenstrual syndrome)   ? Seasonal allergies   ? Spastic colon   ? Thyroid goiter   ? hx of  ?  ?Family History  ?Problem Relation Age of Onset  ? Hyperlipidemia Mother   ?     Has chronic back pain  ? Obesity Father   ? Heart attack Father 19  ?     Died from "massive MI "  ? Hypertension Father   ? Hyperlipidemia Other   ?     siblings  ? CAD Other   ?     Several paternal uncles and aunts all with CAD before age 23  ? Multiple sclerosis Sister   ? Diabetes Mellitus II Brother   ? Heart attack Paternal Grandfather 75  ? High Cholesterol Brother   ?  ?Past Surgical History:  ?Procedure Laterality Date  ? ABDOMINAL HYSTERECTOMY    ? bladder stent    ? for stones  ? bladder stent removal    ? BREAST BIOPSY Right   ? 2018  ? childbirthx2    ? COLONOSCOPY  2013  ? GALLBLADDER SURGERY  12/2014  ? KIDNEY STONE SURGERY  1975  ? TEMPOROMANDIBULAR JOINT SURGERY  1995  ? TONSILLECTOMY    ? TOTAL ABDOMINAL HYSTERECTOMY W/ BILATERAL SALPINGOOPHORECTOMY  1985  ? WRIST SURGERY  2017  ? ?Social History  ? ?Occupational History  ? Occupation: retired - Physiological scientist  ?  Comment: [employer]  ?Tobacco Use  ? Smoking status: Former  ?  Packs/day: 0.30  ?  Years: 12.00  ?  Pack years: 3.60  ?  Types: Cigarettes  ?  Quit date: 07/20/2004  ?  Years since quitting: 17.3  ? Smokeless tobacco: Never  ?Substance and Sexual Activity  ? Alcohol use: Yes  ?  Alcohol/week: 0.0 standard drinks  ?  Comment: occ.   ? Drug use: No  ? Sexual activity: Not on  file  ? ? ? ?Valeria Batman, MD ? ? ?Note - This record has been created using [employer].  ?Chart creation errors have been sought, but may not always  ?have been located. Such creation errors do not reflect on  ?  the standard of medical care. ? ? ?

## 2021-11-24 DIAGNOSIS — M5459 Other low back pain: Secondary | ICD-10-CM | POA: Diagnosis not present

## 2021-11-24 DIAGNOSIS — M545 Low back pain, unspecified: Secondary | ICD-10-CM | POA: Diagnosis not present

## 2021-11-27 DIAGNOSIS — M545 Low back pain, unspecified: Secondary | ICD-10-CM | POA: Diagnosis not present

## 2021-12-02 DIAGNOSIS — M545 Low back pain, unspecified: Secondary | ICD-10-CM | POA: Diagnosis not present

## 2021-12-04 DIAGNOSIS — M545 Low back pain, unspecified: Secondary | ICD-10-CM | POA: Diagnosis not present

## 2021-12-05 DIAGNOSIS — I10 Essential (primary) hypertension: Secondary | ICD-10-CM | POA: Diagnosis not present

## 2021-12-05 DIAGNOSIS — Z9181 History of falling: Secondary | ICD-10-CM | POA: Diagnosis not present

## 2021-12-05 DIAGNOSIS — Z7984 Long term (current) use of oral hypoglycemic drugs: Secondary | ICD-10-CM | POA: Diagnosis not present

## 2021-12-05 DIAGNOSIS — Z87891 Personal history of nicotine dependence: Secondary | ICD-10-CM | POA: Diagnosis not present

## 2021-12-05 DIAGNOSIS — E785 Hyperlipidemia, unspecified: Secondary | ICD-10-CM | POA: Diagnosis not present

## 2021-12-05 DIAGNOSIS — K219 Gastro-esophageal reflux disease without esophagitis: Secondary | ICD-10-CM | POA: Diagnosis not present

## 2021-12-05 DIAGNOSIS — E119 Type 2 diabetes mellitus without complications: Secondary | ICD-10-CM | POA: Diagnosis not present

## 2021-12-07 DIAGNOSIS — J029 Acute pharyngitis, unspecified: Secondary | ICD-10-CM | POA: Diagnosis not present

## 2021-12-09 DIAGNOSIS — E119 Type 2 diabetes mellitus without complications: Secondary | ICD-10-CM | POA: Diagnosis not present

## 2021-12-09 DIAGNOSIS — M545 Low back pain, unspecified: Secondary | ICD-10-CM | POA: Diagnosis not present

## 2021-12-11 DIAGNOSIS — M545 Low back pain, unspecified: Secondary | ICD-10-CM | POA: Diagnosis not present

## 2021-12-22 DIAGNOSIS — R0981 Nasal congestion: Secondary | ICD-10-CM | POA: Diagnosis not present

## 2021-12-22 DIAGNOSIS — M545 Low back pain, unspecified: Secondary | ICD-10-CM | POA: Diagnosis not present

## 2021-12-22 DIAGNOSIS — J019 Acute sinusitis, unspecified: Secondary | ICD-10-CM | POA: Diagnosis not present

## 2021-12-22 DIAGNOSIS — I10 Essential (primary) hypertension: Secondary | ICD-10-CM | POA: Diagnosis not present

## 2022-02-16 DIAGNOSIS — M79645 Pain in left finger(s): Secondary | ICD-10-CM | POA: Diagnosis not present

## 2022-02-16 DIAGNOSIS — M79644 Pain in right finger(s): Secondary | ICD-10-CM | POA: Diagnosis not present

## 2022-02-16 DIAGNOSIS — M13842 Other specified arthritis, left hand: Secondary | ICD-10-CM | POA: Diagnosis not present

## 2022-02-19 DIAGNOSIS — I10 Essential (primary) hypertension: Secondary | ICD-10-CM | POA: Diagnosis not present

## 2022-02-19 DIAGNOSIS — N200 Calculus of kidney: Secondary | ICD-10-CM | POA: Diagnosis not present

## 2022-02-19 DIAGNOSIS — I2510 Atherosclerotic heart disease of native coronary artery without angina pectoris: Secondary | ICD-10-CM | POA: Diagnosis not present

## 2022-02-19 DIAGNOSIS — E1142 Type 2 diabetes mellitus with diabetic polyneuropathy: Secondary | ICD-10-CM | POA: Diagnosis not present

## 2022-02-19 DIAGNOSIS — M545 Low back pain, unspecified: Secondary | ICD-10-CM | POA: Diagnosis not present

## 2022-02-19 DIAGNOSIS — M8580 Other specified disorders of bone density and structure, unspecified site: Secondary | ICD-10-CM | POA: Diagnosis not present

## 2022-02-19 DIAGNOSIS — H35379 Puckering of macula, unspecified eye: Secondary | ICD-10-CM | POA: Diagnosis not present

## 2022-02-19 DIAGNOSIS — G63 Polyneuropathy in diseases classified elsewhere: Secondary | ICD-10-CM | POA: Diagnosis not present

## 2022-02-19 DIAGNOSIS — N6019 Diffuse cystic mastopathy of unspecified breast: Secondary | ICD-10-CM | POA: Diagnosis not present

## 2022-02-19 DIAGNOSIS — E785 Hyperlipidemia, unspecified: Secondary | ICD-10-CM | POA: Diagnosis not present

## 2022-02-19 DIAGNOSIS — E049 Nontoxic goiter, unspecified: Secondary | ICD-10-CM | POA: Diagnosis not present

## 2022-02-19 DIAGNOSIS — K760 Fatty (change of) liver, not elsewhere classified: Secondary | ICD-10-CM | POA: Diagnosis not present

## 2022-03-10 DIAGNOSIS — E119 Type 2 diabetes mellitus without complications: Secondary | ICD-10-CM | POA: Diagnosis not present

## 2022-03-27 DIAGNOSIS — M5442 Lumbago with sciatica, left side: Secondary | ICD-10-CM | POA: Diagnosis not present

## 2022-03-27 DIAGNOSIS — S22018D Other fracture of first thoracic vertebra, subsequent encounter for fracture with routine healing: Secondary | ICD-10-CM | POA: Diagnosis not present

## 2022-03-27 DIAGNOSIS — G8929 Other chronic pain: Secondary | ICD-10-CM | POA: Diagnosis not present

## 2022-05-15 DIAGNOSIS — M47816 Spondylosis without myelopathy or radiculopathy, lumbar region: Secondary | ICD-10-CM | POA: Diagnosis not present

## 2022-06-06 DIAGNOSIS — E119 Type 2 diabetes mellitus without complications: Secondary | ICD-10-CM | POA: Diagnosis not present

## 2022-06-18 ENCOUNTER — Encounter: Payer: Self-pay | Admitting: Internal Medicine

## 2022-07-01 DIAGNOSIS — N6019 Diffuse cystic mastopathy of unspecified breast: Secondary | ICD-10-CM | POA: Diagnosis not present

## 2022-07-01 DIAGNOSIS — M8580 Other specified disorders of bone density and structure, unspecified site: Secondary | ICD-10-CM | POA: Diagnosis not present

## 2022-07-01 DIAGNOSIS — K760 Fatty (change of) liver, not elsewhere classified: Secondary | ICD-10-CM | POA: Diagnosis not present

## 2022-07-01 DIAGNOSIS — M545 Low back pain, unspecified: Secondary | ICD-10-CM | POA: Diagnosis not present

## 2022-07-01 DIAGNOSIS — I10 Essential (primary) hypertension: Secondary | ICD-10-CM | POA: Diagnosis not present

## 2022-07-01 DIAGNOSIS — E1142 Type 2 diabetes mellitus with diabetic polyneuropathy: Secondary | ICD-10-CM | POA: Diagnosis not present

## 2022-07-01 DIAGNOSIS — E049 Nontoxic goiter, unspecified: Secondary | ICD-10-CM | POA: Diagnosis not present

## 2022-07-01 DIAGNOSIS — G63 Polyneuropathy in diseases classified elsewhere: Secondary | ICD-10-CM | POA: Diagnosis not present

## 2022-07-01 DIAGNOSIS — E785 Hyperlipidemia, unspecified: Secondary | ICD-10-CM | POA: Diagnosis not present

## 2022-07-01 DIAGNOSIS — I2510 Atherosclerotic heart disease of native coronary artery without angina pectoris: Secondary | ICD-10-CM | POA: Diagnosis not present

## 2022-07-01 DIAGNOSIS — K580 Irritable bowel syndrome with diarrhea: Secondary | ICD-10-CM | POA: Diagnosis not present

## 2022-07-03 ENCOUNTER — Encounter: Payer: Self-pay | Admitting: Internal Medicine

## 2022-07-30 ENCOUNTER — Ambulatory Visit (AMBULATORY_SURGERY_CENTER): Payer: Medicare HMO

## 2022-07-30 VITALS — Ht 63.0 in | Wt 152.0 lb

## 2022-07-30 DIAGNOSIS — Z1211 Encounter for screening for malignant neoplasm of colon: Secondary | ICD-10-CM

## 2022-07-30 NOTE — Progress Notes (Signed)

## 2022-08-03 DIAGNOSIS — N390 Urinary tract infection, site not specified: Secondary | ICD-10-CM | POA: Diagnosis not present

## 2022-08-13 DIAGNOSIS — H524 Presbyopia: Secondary | ICD-10-CM | POA: Diagnosis not present

## 2022-08-13 DIAGNOSIS — E119 Type 2 diabetes mellitus without complications: Secondary | ICD-10-CM | POA: Diagnosis not present

## 2022-08-17 ENCOUNTER — Other Ambulatory Visit: Payer: Self-pay | Admitting: Endocrinology

## 2022-08-17 DIAGNOSIS — Z1231 Encounter for screening mammogram for malignant neoplasm of breast: Secondary | ICD-10-CM

## 2022-08-18 ENCOUNTER — Encounter: Payer: Self-pay | Admitting: Internal Medicine

## 2022-08-26 ENCOUNTER — Encounter: Payer: Self-pay | Admitting: Internal Medicine

## 2022-08-26 ENCOUNTER — Ambulatory Visit (AMBULATORY_SURGERY_CENTER): Payer: Medicare HMO | Admitting: Internal Medicine

## 2022-08-26 ENCOUNTER — Telehealth: Payer: Self-pay | Admitting: Internal Medicine

## 2022-08-26 VITALS — BP 131/76 | HR 77 | Temp 97.1°F | Resp 14 | Ht 63.0 in

## 2022-08-26 DIAGNOSIS — Z1211 Encounter for screening for malignant neoplasm of colon: Secondary | ICD-10-CM

## 2022-08-26 DIAGNOSIS — E119 Type 2 diabetes mellitus without complications: Secondary | ICD-10-CM | POA: Diagnosis not present

## 2022-08-26 DIAGNOSIS — I10 Essential (primary) hypertension: Secondary | ICD-10-CM | POA: Diagnosis not present

## 2022-08-26 MED ORDER — SODIUM CHLORIDE 0.9 % IV SOLN: 500.0000 mL | Freq: Once | INTRAVENOUS | Status: DC

## 2022-08-26 NOTE — Telephone Encounter (Signed)
Pt made aware of Dr. Carlean Purl recommendations: Pt scheduled for 09/08/2022 at 9:10 AM. With Dr Carlean Purl Pt made aware Pt verbalized understanding with all questions answered.

## 2022-08-26 NOTE — Telephone Encounter (Signed)
Husband + patient reported she suffers w/ chronic diarrhea (in recovery after colonoscopy today)  She needs an office visit w/ me non-urgent next available, please

## 2022-08-26 NOTE — Progress Notes (Signed)
Pt's states no medical or surgical changes since previsit or office visit. 

## 2022-08-26 NOTE — Progress Notes (Signed)
Report to pacu rn. Vss. Care resumed by rn. 

## 2022-08-26 NOTE — Patient Instructions (Addendum)
No polyps or cancer were seen.  Still have diverticulosis and hemorrhoids were swollen.  You do not need any further routine colonoscopy or stool testing for blood.  If you have digestive health problems and colonoscopy is appropriate for diagnosis then we could do one (hope not necessary).  I appreciate the opportunity to care for you. Gatha Mayer, MD, [facility]  Handout on hemorrhoids and diverticulosis provided   YOU HAD AN ENDOSCOPIC PROCEDURE TODAY AT [facility]:   Refer to the procedure report that was given to you for any specific questions about what was found during the examination.  If the procedure report does not answer your questions, please call your gastroenterologist to clarify.  If you requested that your care partner not be given the details of your procedure findings, then the procedure report has been included in a sealed envelope for you to review at your convenience later.  YOU SHOULD EXPECT: Some feelings of bloating in the abdomen. Passage of more gas than usual.  Walking can help get rid of the air that was put into your GI tract during the procedure and reduce the bloating. If you had a lower endoscopy (such as a colonoscopy or flexible sigmoidoscopy) you may notice spotting of blood in your stool or on the toilet paper. If you underwent a bowel prep for your procedure, you may not have a normal bowel movement for a few days.  Please Note:  You might notice some irritation and congestion in your nose or some drainage.  This is from the oxygen used during your procedure.  There is no need for concern and it should clear up in a day or so.  SYMPTOMS TO REPORT IMMEDIATELY:  Following lower endoscopy (colonoscopy or flexible sigmoidoscopy):  Excessive amounts of blood in the stool  Significant tenderness or worsening of abdominal pains  Swelling of the abdomen that is new, acute  Fever of 100F or higher  For urgent or emergent issues, a  gastroenterologist can be reached at any hour by calling [phone]. Do not use MyChart messaging for urgent concerns.    DIET:  We do recommend a small meal at first, but then you may proceed to your regular diet.  Drink plenty of fluids but you should avoid alcoholic beverages for 24 hours.  ACTIVITY:  You should plan to take it easy for the rest of today and you should NOT DRIVE or use heavy machinery until tomorrow (because of the sedation medicines used during the test).    FOLLOW UP: Our staff will call the number listed on your records the next business day following your procedure.  We will call around 7:15- 8:00 am to check on you and address any questions or concerns that you may have regarding the information given to you following your procedure. If we do not reach you, we will leave a message.     If any biopsies were taken you will be contacted by phone or by letter within the next 1-3 weeks.  Please call us at [phone] if you have not heard about the biopsies in 3 weeks.    SIGNATURES/CONFIDENTIALITY: You and/or your care partner have signed paperwork which will be entered into your electronic medical record.  These signatures attest to the fact that that the information above on your After Visit Summary has been reviewed and is understood.  Full responsibility of the confidentiality of this discharge information lies with you and/or your care-partner.

## 2022-08-26 NOTE — Progress Notes (Signed)
[facility] History and Physical   Primary Care Physician:  Reynold Bowen, MD   Reason for Procedure:   Colon cancer screening  Plan:    colonoscopy     HPI: Michelle Spears is a 77 y.o. female herefor screening exam  Last colonoscopy 2013   Past Medical History:  Diagnosis Date   Allergy    Arthritis    Diabetes mellitus without complication ([place])    Diverticulosis 2007   Essential hypertension    GERD (gastroesophageal reflux disease)    Glucose intolerance    /Prediabetes -> most recent hemoglobin A1c was 6.6   Headache(784.0)    Hepatic cyst    History of hysterectomy    nonmalignant reasons   Hypertriglyceridemia    Internal hemorrhoids    Kidney stone 1975/2004   PMS (premenstrual syndrome)    Seasonal allergies    Spastic colon    Thyroid goiter    hx of    Past Surgical History:  Procedure Laterality Date   ABDOMINAL HYSTERECTOMY     bladder stent     for stones   bladder stent removal     BREAST BIOPSY Right    2018   childbirthx2     COLONOSCOPY  2013   GALLBLADDER SURGERY  12/2014   [place]   TONSILLECTOMY     TOTAL ABDOMINAL HYSTERECTOMY W/ BILATERAL SALPINGOOPHORECTOMY  1985   WRIST SURGERY  2017    Prior to Admission medications   Medication Sig Start Date End Date Taking? Authorizing Provider  Ascorbic Acid (VITAMIN C) 1000 MG tablet Take 1,000 mg by mouth daily.   Yes [provider]  aspirin EC 81 MG tablet Take 81 mg by mouth daily.   Yes [provider]  atorvastatin (LIPITOR) 10 MG tablet Take 10 mg by mouth daily.   Yes [provider]  Calcium Carb-Cholecalciferol (CALTRATE 600+D3) 600-800 MG-UNIT TABS daily. 11/22/18  Yes [provider]  cetirizine (ZYRTEC) 10 MG tablet TAKE 1-2 TIMES PER DAY FOR ITCHING 08/01/15  Yes Kozlow, Donnamarie Poag, MD  fluticasone (FLONASE) 50 MCG/ACT nasal spray Place 1 spray into both nostrils daily as  needed.    Yes [provider]  gabapentin (NEURONTIN) 300 MG capsule Take 300 mg by mouth 2 (two) times daily as needed.   Yes [provider]  glucose blood test strip One touch verio, test once daily, Dx 250.00 04/26/13  Yes Dorena Cookey, MD  lisinopril-hydrochlorothiazide (ZESTORETIC) 10-12.5 MG tablet take one by mouth once a day 08/21/19  Yes [provider]  metFORMIN (GLUCOPHAGE) 500 MG tablet Take by mouth 2 (two) times daily with a meal.   Yes [provider]  Omega-3 Fatty Acids (FISH OIL) 1200 MG CAPS Take 1 capsule by mouth daily.   Yes [provider]  omeprazole (PRILOSEC) 20 MG capsule Take 20 mg by mouth every morning.   Yes [provider]  CALCIUM PO Take 500 mg by mouth 2 (two) times daily.     [provider]  magnesium gluconate (MAGONATE) 500 MG tablet Take 500 mg by mouth 2 (two) times daily.    [provider]  meclizine (ANTIVERT) 25 MG tablet take one by mouth every 4 to 6 hrs prn dizzy 06/28/20   [provider]  meloxicam (MOBIC) 15 MG tablet Take 15 mg by mouth daily.    [provider]    Current Outpatient  Medications  Medication Sig Dispense Refill   Ascorbic Acid (VITAMIN C) 1000 MG tablet Take 1,000 mg by mouth daily.     aspirin EC 81 MG tablet Take 81 mg by mouth daily.     atorvastatin (LIPITOR) 10 MG tablet Take 10 mg by mouth daily.     Calcium Carb-Cholecalciferol (CALTRATE 600+D3) 600-800 MG-UNIT TABS daily.     cetirizine (ZYRTEC) 10 MG tablet TAKE 1-2 TIMES PER DAY FOR ITCHING 60 tablet 5   fluticasone (FLONASE) 50 MCG/ACT nasal spray Place 1 spray into both nostrils daily as needed.      gabapentin (NEURONTIN) 300 MG capsule Take 300 mg by mouth 2 (two) times daily as needed.     glucose blood test strip One touch verio, test once daily, Dx 250.00 100 each 3   lisinopril-hydrochlorothiazide (ZESTORETIC) 10-12.5 MG tablet take one by mouth once a day      metFORMIN (GLUCOPHAGE) 500 MG tablet Take by mouth 2 (two) times daily with a meal.     Omega-3 Fatty Acids (FISH OIL) 1200 MG CAPS Take 1 capsule by mouth daily.     omeprazole (PRILOSEC) 20 MG capsule Take 20 mg by mouth every morning.     CALCIUM PO Take 500 mg by mouth 2 (two) times daily.      magnesium gluconate (MAGONATE) 500 MG tablet Take 500 mg by mouth 2 (two) times daily.     meclizine (ANTIVERT) 25 MG tablet take one by mouth every 4 to 6 hrs prn dizzy     meloxicam (MOBIC) 15 MG tablet Take 15 mg by mouth daily.     Current Facility-Administered Medications  Medication Dose Route Frequency Provider Last Rate Last Admin   0.9 %  sodium chloride infusion  500 mL Intravenous Once Gatha Mayer, MD        Allergies as of 08/26/2022   (No Known Allergies)    Family History  Problem Relation Age of Onset   Hyperlipidemia Mother        Has chronic back pain   Obesity Father    Heart attack Father 94       Died from "massive MI "   Hypertension Father    Multiple sclerosis Sister    Diabetes Mellitus II Brother    High Cholesterol Brother    Heart attack Paternal Grandfather 19   Hyperlipidemia Other        siblings   CAD Other        Several paternal uncles and aunts all with CAD before age 38   Colon cancer Neg Hx    Colon polyps Neg Hx    Esophageal cancer Neg Hx    Rectal cancer Neg Hx    Stomach cancer Neg Hx     Social History   Socioeconomic History   Marital status: Married    Spouse name: Not on file   Number of children: 2   Years of education: 12   Highest education level: Not on file  Occupational History   Occupation: retired - Marketing executive    Comment: [employer]  Tobacco Use   Smoking status: Former    Packs/day: 0.30    Years: 12.00    Total pack years: 3.60    Types: Cigarettes    Quit date: 07/20/2004    Years since quitting: 18.1   Smokeless tobacco: Never  Vaping Use   Vaping Use: Never used  Substance and Sexual  Activity   Alcohol use:  Yes    Alcohol/week: 0.0 standard drinks of alcohol    Comment: occ.    Drug use: No   Sexual activity: Not on file  Other Topics Concern   Not on file  Social History Narrative   She lives in [place] with her spouse. They have 2 children and 4 grandchildren.   She quit smoking in 2006. Does not drink alcohol.   She works out 3 days a week for 1 1/2 hr with a workout program known as "shapes" - however that Clair Gulling is checked down, and she is now having to find a new program. 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   Social Determinants of Health   Financial Resource Strain: Not on file  Food Insecurity: Not on file  Transportation Needs: Not on file  Physical Activity: Not on file  Stress: Not on file  Social Connections: Not on file  Intimate Partner Violence: Not on file    Review of Systems:  All other review of systems negative except as mentioned in the HPI.  Physical Exam: Vital signs BP (!) 145/76   Pulse 91   Temp (!) 97.1 F (36.2 C)   Ht '5\' 3"'$  (1.6 m)   SpO2 97%   BMI 26.93 kg/m   General:   Alert,  Well-developed, well-nourished, pleasant and cooperative in NAD Lungs:  Clear throughout to auscultation.   Heart:  Regular rate and rhythm; no murmurs, clicks, rubs,  or gallops. Abdomen:  Soft, nontender and nondistended. Normal bowel sounds.   Neuro/Psych:  Alert and cooperative. Normal mood and affect. A and O x 3   '@Lillianah Swartzentruber'$  Simonne Maffucci, MD, [facility] [phone] (pager) 08/26/2022 10:26 AM@

## 2022-08-26 NOTE — Op Note (Signed)
[facility] Patient Name: Michelle Spears Procedure Date: 08/26/2022 10:25 AM MRN: [id number] Endoscopist: Iva Boop , MD, [id number] Age: 77 Referring MD:  Date of Birth: 07-06-1946 Gender: Female Account #: [id number] Procedure:                Colonoscopy Indications:              Screening for colorectal malignant neoplasm, Last                            colonoscopy: 2013 Medicines:                Monitored Anesthesia Care Procedure:                Pre-Anesthesia Assessment:                           - Prior to the procedure, a History and Physical                            was performed, and patient medications and                            allergies were reviewed. The patient's tolerance of                            previous anesthesia was also reviewed. The risks                            and benefits of the procedure and the sedation                            options and risks were discussed with the patient.                            All questions were answered, and informed consent                            was obtained. Prior Anticoagulants: The patient has                            taken no anticoagulant or antiplatelet agents. ASA                            Grade Assessment: III - A patient with severe                            systemic disease. After reviewing the risks and                            benefits, the patient was deemed in satisfactory                            condition to undergo the procedure.  After obtaining informed consent, the colonoscope                            was passed under direct vision. Throughout the                            procedure, the patient's blood pressure, pulse, and                            oxygen saturations were monitored continuously. The                            Olympus CF-HQ190L ([id number]) Colonoscope was                            introduced through the anus and advanced to  the the                            cecum, identified by appendiceal orifice and                            ileocecal valve. The colonoscopy was performed                            without difficulty. The patient tolerated the                            procedure well. The quality of the bowel                            preparation was good. The ileocecal valve,                            appendiceal orifice, and rectum were photographed.                            The bowel preparation used was Miralax via split                            dose instruction. Scope In: 10:34:25 AM Scope Out: 10:46:42 AM Scope Withdrawal Time: 0 hours 9 minutes 19 seconds  Total Procedure Duration: 0 hours 12 minutes 17 seconds  Findings:                 The perianal and digital rectal examinations were                            normal.                           Multiple diverticula were found in the sigmoid                            colon. There was narrowing of the colon in  association with the diverticular opening.                           Internal hemorrhoids were found.                           The exam was otherwise without abnormality on                            direct and retroflexion views. Complications:            No immediate complications. Estimated Blood Loss:     Estimated blood loss: none. Impression:               - Severe diverticulosis in the sigmoid colon. There                            was narrowing of the colon in association with the                            diverticular opening.                           - Internal hemorrhoids.                           - The examination was otherwise normal on direct                            and retroflexion views.                           - No specimens collected. Recommendation:           - Patient has a contact number available for                            emergencies. The signs and symptoms of  potential                            delayed complications were discussed with the                            patient. Return to normal activities tomorrow.                            Written discharge instructions were provided to the                            patient.                           - Resume previous diet.                           - Continue present medications.                           -  No repeat colonoscopy due to age and the absence                            of colonic polyps. Iva Boop, MD 08/26/2022 10:56:35 AM This report has been signed electronically.

## 2022-08-27 ENCOUNTER — Telehealth: Payer: Self-pay | Admitting: *Deleted

## 2022-08-27 NOTE — Telephone Encounter (Signed)
  Follow up Call-     08/26/2022    9:57 AM  Call back number  Post procedure Call Back phone  # [phone] cell.  Permission to leave phone message Yes     Patient questions:  Do you have a fever, pain , or abdominal swelling? No. Pain Score  0 *  Have you tolerated food without any problems? Yes.    Have you been able to return to your normal activities? Yes.    Do you have any questions about your discharge instructions: Diet   No. Medications  No. Follow up visit  No.  Do you have questions or concerns about your Care? No.  Actions: * If pain score is 4 or above: No action needed, pain <4.

## 2022-09-02 DIAGNOSIS — E119 Type 2 diabetes mellitus without complications: Secondary | ICD-10-CM | POA: Diagnosis not present

## 2022-09-07 DIAGNOSIS — Z0100 Encounter for examination of eyes and vision without abnormal findings: Secondary | ICD-10-CM | POA: Diagnosis not present

## 2022-09-08 ENCOUNTER — Ambulatory Visit: Payer: Medicare HMO | Admitting: Internal Medicine

## 2022-09-08 ENCOUNTER — Encounter: Payer: Self-pay | Admitting: Internal Medicine

## 2022-09-08 ENCOUNTER — Other Ambulatory Visit: Payer: Medicare HMO

## 2022-09-08 VITALS — BP 138/80 | HR 81 | Ht 63.0 in | Wt 154.0 lb

## 2022-09-08 DIAGNOSIS — R197 Diarrhea, unspecified: Secondary | ICD-10-CM | POA: Diagnosis not present

## 2022-09-08 DIAGNOSIS — K580 Irritable bowel syndrome with diarrhea: Secondary | ICD-10-CM | POA: Diagnosis not present

## 2022-09-08 NOTE — Progress Notes (Signed)
Michelle Spears 77 y.o. December 26, 1945 [id number]  Assessment & Plan:   Encounter Diagnoses  Name Primary?   Diarrhea, unspecified type Yes   Irritable bowel syndrome with diarrhea     I think she has IBS.  We will evaluate for celiac disease.  She will use Imodium up to 4 a day and she can use this regularly or as needed.  Other recommendations are to try to reduce the amount of fruit in her diet as that may be making things worse.  She does not need increased fiber as she is a diarrhea predominant IBS person.  I have also asked her to consider diversify her microbiome by trying to eat 30 different plants a week and to eat probiotic foods which I think are better than capsules with probiotics (evidence has not really supported the supplements and they are costly).  Orders Placed This Encounter  Procedures   Tissue transglutaminase, IgA   IgA   If her celiac serologies are positive we will need to consider an EGD with duodenal biopsies versus diet restriction alone.  Follow-up will be coordinated pending review of the labs.  CC: [name], MD  Subjective:   Chief Complaint: Diarrhea  HPI 77 year old white woman with recent negative screening colonoscopy, and did recover.  Her husband mentions she struggles with diarrhea.  Patient reports that she has really had diarrhea her whole life, off and on.  She was placed on metformin a number of years ago and this worsened.  Often this is a postprandial urge to defecate.  Rarely perhaps occasionally nocturnal.  Strangely when her metformin dose was increased from 500 to 1000 mg daily things were not as bad.  She also thinks after her recent colonoscopy she does not struggle as much.  Occasional mild cramps with the increased gastrocolic reflex but on the whole not having abdominal pain problems.  Defecation history is that in the morning she will have a stool that is formed but "breaks out when it is in the water).  Then she will have  multiple bowel movements over the next several hours all much looser and then watery.  Imodium A-D is helpful.  She may take up to 3 in a day.  She will take 2 and then 1 more and that stops the diarrhea and she does not get severe constipation.  She has not eliminated lactose but she switched to almond milk and that did not help.  She eats "a lot of fruit" and she knows that is a trigger.  She is taking a pill a probiotic.  Sometimes hemorrhoids will swell when she has excessive diarrhea and Preparation H treats these.  She does not drink diet sodas or use artificial sweeteners that I am aware and from her history, and she does drink about 2 cans of Coke a week.  Wt Readings from Last 3 Encounters:  09/08/22 154 lb (69.9 kg)  07/30/22 152 lb (68.9 kg)  10/30/21 150 lb (68 kg)   Colonoscopy 08/26/2022 severe sigmoid diverticulosis and hemorrhoids otherwise negative. No Known Allergies Current Meds  Medication Sig   Ascorbic Acid (VITAMIN C) 1000 MG tablet Take 1,000 mg by mouth daily.   aspirin EC 81 MG tablet Take 81 mg by mouth daily.   atorvastatin (LIPITOR) 10 MG tablet Take 10 mg by mouth daily.   Calcium Carb-Cholecalciferol (CALTRATE 600+D3) 600-800 MG-UNIT TABS daily.   CALCIUM PO Take 500 mg by mouth 2 (two) times daily.    cetirizine (  ZYRTEC) 10 MG tablet TAKE 1-2 TIMES PER DAY FOR ITCHING   fluticasone (FLONASE) 50 MCG/ACT nasal spray Place 1 spray into both nostrils daily as needed.    gabapentin (NEURONTIN) 300 MG capsule Take 300 mg by mouth 2 (two) times daily as needed.   glucose blood test strip One touch verio, test once daily, Dx 250.00   lisinopril-hydrochlorothiazide (ZESTORETIC) 10-12.5 MG tablet take one by mouth once a day   meloxicam (MOBIC) 15 MG tablet Take 15 mg by mouth daily.   metFORMIN (GLUCOPHAGE) 500 MG tablet Take by mouth 2 (two) times daily with a meal.   Omega-3 Fatty Acids (FISH OIL) 1200 MG CAPS Take 1 capsule by mouth daily.   omeprazole (PRILOSEC) 20 MG  capsule Take 20 mg by mouth every morning.   Past Medical History:  Diagnosis Date   Allergy    Arthritis    Diabetes mellitus without complication ([facility])    Diverticulosis 2007   Essential hypertension    GERD (gastroesophageal reflux disease)    Glucose intolerance    /Prediabetes -> most recent hemoglobin A1c was 6.6   Headache(784.0)    Hepatic cyst    History of hysterectomy    nonmalignant reasons   Hypertriglyceridemia    Internal hemorrhoids    Kidney stone 1975/2004   PMS (premenstrual syndrome)    Seasonal allergies    Spastic colon    Thyroid goiter    hx of   Past Surgical History:  Procedure Laterality Date   ABDOMINAL HYSTERECTOMY     bladder stent     for stones   bladder stent removal     BREAST BIOPSY Right    2018   childbirthx2     COLONOSCOPY  2013   GALLBLADDER SURGERY  12/2014   KIDNEY STONE SURGERY  1975   TEMPOROMANDIBULAR JOINT SURGERY  1995   TONSILLECTOMY     TOTAL ABDOMINAL HYSTERECTOMY W/ BILATERAL SALPINGOOPHORECTOMY  1985   WRIST SURGERY  2017   Social History   Social History Narrative   She lives in [place] with her spouse. They have 2 children and 4 grandchildren.   She quit smoking in 2006. Does not drink alcohol.   She works out 3 days a week for 1 1/2 hr with a workout program known as "shapes" - however that Rosanne Ashing is checked down, and she is now having to find a new program. 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   family history includes CAD in an other family member; Diabetes Mellitus II in her brother; Heart attack (age of onset: 25) in her father; Heart attack (age of onset: 84) in her paternal grandfather; High Cholesterol in her brother; Hyperlipidemia in her mother and another family member; Hypertension in her father; Multiple sclerosis in her sister; Obesity in her father.   Review of Systems As above  Objective:   Physical Exam BP 138/80   Pulse 81    Ht 5\' 3"  (1.6 m)   Wt 154 lb (69.9 kg)   SpO2 97%   BMI 27.28 kg/m

## 2022-09-08 NOTE — Patient Instructions (Addendum)
I think you have IBS. Will test for gluen allergy.  Please do the following:  Reduce and eliminate foods/drinks with added sugars  Reduce fruit consumption.  Do not over do fiber.  Use Imodium AD up to 4 a day - try 2 at bedtime to start  Stop probiotic pill.  Try to get 30 different plants in your meals each week.  This is easier than it sounds since it includes vegetables, fruits, seeds and nuts as well as spices.  Also try to have a variety of colors in your plants for the week. The purpose is to feed and support a diverse gut microbiome-the bacteria that live inside of you.  Probiotics are important for health, they are best gotten through foods.  Pills are not proven to be effective in some research is suggesting they may actually cause problems.  Here are some examples of probiotic foods (also known as fermented foods):  Yogurt-high protein whole fat yogurt is best I believe, [place] yogurt is an example.  Add your own fruit because sugar is almost always added to yogurt with fruit in it found at the store.  Kefir-kind of like liquid yogurt  Miso-an Asian fermented bean paste  Sauerkraut  Kimchi-Asian fermented cabbage  Apple cider vinegar-use the liquid not Gummies or pills.  Some people can tolerate a spoonful of this alone but many fine adding it to water reduces the acidity.  You can work up to higher doses.  If taking it on deluded rinse your mouth or drink water afterwards to reduce risk of dental damage.  A tablespoon a day is a good dose.  You could take more also.  Some people also find this helpful with reflux.   _______________________________________________________  If your blood pressure at your visit was 140/90 or greater, please contact your primary care physician to follow up on this.  _______________________________________________________  If you are age 50 or older, your body mass index should be between 23-30. Your Body mass index is 27.28 kg/m. If  this is out of the aforementioned range listed, please consider follow up with your Primary Care Provider.  If you are age 27 or younger, your body mass index should be between 19-25. Your Body mass index is 27.28 kg/m. If this is out of the aformentioned range listed, please consider follow up with your Primary Care Provider.   ________________________________________________________  The [place] [facility] providers would like to encourage you to use [facility] to communicate with providers for non-urgent requests or questions.  Due to long hold times on the telephone, sending your provider a message by [facility] may be a faster and more efficient way to get a response.  Please allow 48 business hours for a response.  Please remember that this is for non-urgent requests.  _______________________________________________________ Thank you for trusting me with your gastrointestinal care!   Silvano Rusk MD

## 2022-09-09 LAB — TISSUE TRANSGLUTAMINASE, IGA: (tTG) Ab, IgA: <1 U/mL

## 2022-09-09 LAB — IGA: Immunoglobulin A: 142 mg/dL (ref 70–320)

## 2022-09-22 ENCOUNTER — Telehealth: Payer: Self-pay | Admitting: Internal Medicine

## 2022-09-22 NOTE — Telephone Encounter (Signed)
Patient is calling states she is experiencing abdominal pain and abnormal bowel movements. She is looking to speak with the nurse about what can be done. Please advise

## 2022-09-22 NOTE — Telephone Encounter (Signed)
Pt stated that she had diarrhea on Saturday  took 2 Imodium, No BM on Sunday, Lower abdominal pain and diarrhea on Monday, 1 episode of emesis Monday,  3 loose BM this AM, some difficulty urinating when having the abdominal pain when having a BM. Low grade temp yesterday 99   took tylenol which helped temp. Please advise

## 2022-09-23 ENCOUNTER — Encounter: Payer: Self-pay | Admitting: Internal Medicine

## 2022-09-23 DIAGNOSIS — R399 Unspecified symptoms and signs involving the genitourinary system: Secondary | ICD-10-CM | POA: Diagnosis not present

## 2022-09-23 NOTE — Telephone Encounter (Signed)
Patient requesting to speak to nurse. Please advise.

## 2022-09-23 NOTE — Telephone Encounter (Signed)
Pt notified of Dr. Carlean Purl recommendations: Pt stated that the diarrhea has eased off although she is still having the lower abdominal pain along with a low grade temp. Pt was notified to reach out to her PCP to be evaluated for a UTI.  Pt verbalized understanding with all questions answered.

## 2022-09-23 NOTE — Telephone Encounter (Signed)
I have diagnosed her with IBS but this sounds like it might be an infection  She should continue supportive care with prn Imodium AD  Urination difficulty - if persists should be evaluated by PCP

## 2022-09-24 ENCOUNTER — Ambulatory Visit: Payer: Medicare HMO | Admitting: Nurse Practitioner

## 2022-09-24 ENCOUNTER — Encounter: Payer: Self-pay | Admitting: Nurse Practitioner

## 2022-09-24 ENCOUNTER — Other Ambulatory Visit (INDEPENDENT_AMBULATORY_CARE_PROVIDER_SITE_OTHER): Payer: Medicare HMO

## 2022-09-24 VITALS — BP 100/60 | HR 112 | Ht 63.0 in | Wt 148.4 lb

## 2022-09-24 DIAGNOSIS — R109 Unspecified abdominal pain: Secondary | ICD-10-CM

## 2022-09-24 DIAGNOSIS — K5790 Diverticulosis of intestine, part unspecified, without perforation or abscess without bleeding: Secondary | ICD-10-CM | POA: Diagnosis not present

## 2022-09-24 LAB — COMPREHENSIVE METABOLIC PANEL
ALT: 32 U/L (ref 0–35)
AST: 17 U/L (ref 0–37)
Albumin: 4 g/dL (ref 3.5–5.2)
Alkaline Phosphatase: 48 U/L (ref 39–117)
BUN: 21 mg/dL (ref 6–23)
CO2: 29 mEq/L (ref 19–32)
Calcium: 9.6 mg/dL (ref 8.4–10.5)
Chloride: 100 mEq/L (ref 96–112)
Creatinine, Ser: 0.75 mg/dL (ref 0.40–1.20)
GFR: 77.85 mL/min (ref >60.00)
Glucose, Bld: 155 mg/dL — ABNORMAL HIGH (ref 70–99)
Potassium: 3.6 mEq/L (ref 3.5–5.1)
Sodium: 140 mEq/L (ref 135–145)
Total Bilirubin: 0.7 mg/dL (ref 0.2–1.2)
Total Protein: 6.9 g/dL (ref 6.0–8.3)

## 2022-09-24 LAB — CBC WITH DIFFERENTIAL/PLATELET
Basophils Absolute: 0 10*3/uL (ref 0.0–0.1)
Basophils Relative: 0.5 % (ref 0.0–3.0)
Eosinophils Absolute: 0.2 10*3/uL (ref 0.0–0.7)
Eosinophils Relative: 1.9 % (ref 0.0–5.0)
HCT: 37.7 % (ref 36.0–46.0)
Hemoglobin: 12.8 g/dL (ref 12.0–15.0)
Lymphocytes Relative: 14.7 % (ref 12.0–46.0)
Lymphs Abs: 1.2 10*3/uL (ref 0.7–4.0)
MCHC: 34 g/dL (ref 30.0–36.0)
MCV: 95.3 fl (ref 78.0–100.0)
Monocytes Absolute: 0.8 10*3/uL (ref 0.1–1.0)
Monocytes Relative: 10.2 % (ref 3.0–12.0)
Neutro Abs: 6 10*3/uL (ref 1.4–7.7)
Neutrophils Relative %: 72.7 % (ref 43.0–77.0)
Platelets: 223 10*3/uL (ref 150.0–400.0)
RBC: 3.96 Mil/uL (ref 3.87–5.11)
RDW: 13.8 % (ref 11.5–15.5)
WBC: 8.3 10*3/uL (ref 4.0–10.5)

## 2022-09-24 MED ORDER — CIPROFLOXACIN HCL 500 MG PO TABS: 500.0000 mg | ORAL_TABLET | Freq: Two times a day (BID) | ORAL | 0 refills | Status: DC

## 2022-09-24 MED ORDER — METRONIDAZOLE 500 MG PO TABS: 500.0000 mg | ORAL_TABLET | Freq: Two times a day (BID) | ORAL | 0 refills | Status: DC

## 2022-09-24 NOTE — Telephone Encounter (Signed)
Michelle Spears  [facility] (supporting Gatha Mayer, MD)16 hours ago (4:48 PM)    I was told to check with my primary care provider to have a urine test done to rule out or confirm an UTI.  I went this afternoon and the test was negative.  I have been speaking to Annie Main for the past two days to try to resolve this issue.  He  informed me that if the test was negative, to contact this office again.  Please give me a call tomorrow morning.  [phone]. Memory Dance

## 2022-09-24 NOTE — Progress Notes (Signed)
09/24/2022 Michelle Spears [id number] June 22, 1946   Chief Complaint: Abdominal pain   History of Present Illness: Michelle Spears is a 77 year old female with a history of arthritis, diabetes mellitus type 2, hypertriglyceridemia, GERD and diverticulosis.  She is known by Dr. Leone Payor.  She presents today for follow-up regarding lower abdominal pain.   July 21, 2022 she developed lower abdominal pain and she was prescribed Cipro 500 mg 1 tab p.o. daily for 5 days without improvement.  August 03, 2022 she was seen by her PCP and a urinalysis was positive for UTI for which she was prescribed Nitrofurantoin x 3 days and her urinary symptoms resolved.  She went a colonoscopy 08/26/2022 which showed severe diverticulosis in the sigmoid colon with associated narrowing, no polyps.  No further colonoscopies recommended due to age.  She was seen in office by Dr. Leone Payor 09/08/2022 due to having diarrhea, likely IBS.  She was directed to eat 30 different plant products to alter her microbiome and to eat probiotic foods.  TTG IgA < 1. IgA 220.  She developed recurrence of lower abdominal pain 09/17/2022 which worsened on Monday, 09/21/2022.  At that time, she described feeling intense lower abdominal pain and felt like she needed to go to the bathroom to pass a BM but could not.  She vomited yellow bilious emesis x 1.  A repeat urinalysis 3/5 was reported negative.  She presents to our office today with persistent lower abdominal pain.  She is passing small bits of stool.  No bloody bowel movements.  2 nights ago she had a temperature of 100.6 and last night her temperature was 99.3 with night sweats. Today, she passed 2 small finger sized stools followed by loose stools with nonbloody mucus x 2.  She continues to have lower abdominal pain which is not severe but is more noticeable when walking and when passing a bowel movement. She reported having diverticulitis in the early 2000's and in 2017.  She believes  her current lower abdominal pain is similar to the abdominal pain she experienced when diagnosed with diverticulitis.  She is having more reflux, currently taking Omeprazole 20 mg daily.  She is having more frequent headaches as well.   Colonoscopy 08/26/2022:  - Severe diverticulosis in the sigmoid colon. There was narrowing of the colon in association with the diverticular opening.  - Internal hemorrhoids. - The examination was otherwise normal on direct and retroflexion views.  - No specimens collected.    Past Medical History:  Diagnosis Date   Allergy    Arthritis    Diabetes mellitus without complication (HCC)    Diverticulosis 2007   Essential hypertension    GERD (gastroesophageal reflux disease)    Glucose intolerance    /Prediabetes -> most recent hemoglobin A1c was 6.6   Headache(784.0)    Hepatic cyst    History of hysterectomy    nonmalignant reasons   Hypertriglyceridemia    Internal hemorrhoids    Kidney stone 1975/2004   PMS (premenstrual syndrome)    Seasonal allergies    Spastic colon    Thyroid goiter    hx of   Past Surgical History:  Procedure Laterality Date   ABDOMINAL HYSTERECTOMY     bladder stent     for stones   bladder stent removal     BREAST BIOPSY Right    2018   childbirthx2     COLONOSCOPY  2013   GALLBLADDER SURGERY  12/2014   KIDNEY STONE  SURGERY  1975   TEMPOROMANDIBULAR JOINT SURGERY  1995   TONSILLECTOMY     TOTAL ABDOMINAL HYSTERECTOMY W/ BILATERAL SALPINGOOPHORECTOMY  1985   WRIST SURGERY  2017   Current Outpatient Medications on File Prior to Visit  Medication Sig Dispense Refill   Ascorbic Acid (VITAMIN C) 1000 MG tablet Take 1,000 mg by mouth daily.     aspirin EC 81 MG tablet Take 81 mg by mouth daily.     atorvastatin (LIPITOR) 10 MG tablet Take 10 mg by mouth daily.     Calcium Carb-Cholecalciferol (CALTRATE 600+D3) 600-800 MG-UNIT TABS daily.     cetirizine (ZYRTEC) 10 MG tablet TAKE 1-2 TIMES PER DAY FOR ITCHING 60  tablet 5   fluticasone (FLONASE) 50 MCG/ACT nasal spray Place 1 spray into both nostrils daily as needed.      gabapentin (NEURONTIN) 300 MG capsule Take 300 mg by mouth 2 (two) times daily as needed.     glucose blood test strip One touch verio, test once daily, Dx 250.00 100 each 3   lisinopril-hydrochlorothiazide (ZESTORETIC) 10-12.5 MG tablet take one by mouth once a day     loperamide (IMODIUM A-D) 2 MG tablet Take 2 mg by mouth as needed for diarrhea or loose stools.     meloxicam (MOBIC) 15 MG tablet Take 15 mg by mouth daily.     metFORMIN (GLUCOPHAGE) 500 MG tablet Take by mouth 2 (two) times daily with a meal.     Multiple Vitamins-Minerals (CENTRUM SILVER PO) Take 1 tablet by mouth daily.     Omega-3 Fatty Acids (FISH OIL) 1200 MG CAPS Take 1 capsule by mouth daily.     omeprazole (PRILOSEC) 20 MG capsule Take 20 mg by mouth every morning.     POTASSIUM PO Take 650 mg by mouth daily.     meclizine (ANTIVERT) 25 MG tablet Take 25 mg by mouth as needed. (Patient not taking: Reported on 09/24/2022)     No current facility-administered medications on file prior to visit.   No Known Allergies  Current Outpatient Medications on File Prior to Visit  Medication Sig Dispense Refill   Ascorbic Acid (VITAMIN C) 1000 MG tablet Take 1,000 mg by mouth daily.     aspirin EC 81 MG tablet Take 81 mg by mouth daily.     atorvastatin (LIPITOR) 10 MG tablet Take 10 mg by mouth daily.     Calcium Carb-Cholecalciferol (CALTRATE 600+D3) 600-800 MG-UNIT TABS daily.     cetirizine (ZYRTEC) 10 MG tablet TAKE 1-2 TIMES PER DAY FOR ITCHING 60 tablet 5   fluticasone (FLONASE) 50 MCG/ACT nasal spray Place 1 spray into both nostrils daily as needed.      gabapentin (NEURONTIN) 300 MG capsule Take 300 mg by mouth 2 (two) times daily as needed.     glucose blood test strip One touch verio, test once daily, Dx 250.00 100 each 3   lisinopril-hydrochlorothiazide (ZESTORETIC) 10-12.5 MG tablet take one by mouth once  a day     loperamide (IMODIUM A-D) 2 MG tablet Take 2 mg by mouth as needed for diarrhea or loose stools.     meloxicam (MOBIC) 15 MG tablet Take 15 mg by mouth daily.     metFORMIN (GLUCOPHAGE) 500 MG tablet Take by mouth 2 (two) times daily with a meal.     Multiple Vitamins-Minerals (CENTRUM SILVER PO) Take 1 tablet by mouth daily.     Omega-3 Fatty Acids (FISH OIL) 1200 MG CAPS Take 1 capsule by  mouth daily.     omeprazole (PRILOSEC) 20 MG capsule Take 20 mg by mouth every morning.     POTASSIUM PO Take 650 mg by mouth daily.     meclizine (ANTIVERT) 25 MG tablet Take 25 mg by mouth as needed. (Patient not taking: Reported on 09/24/2022)     No current facility-administered medications on file prior to visit.   No Known Allergies  Current Medications, Allergies, Past Medical History, Past Surgical History, Family History and Social History were reviewed in [employer] record.  Review of Systems:   Constitutional: See HPI. Respiratory: Negative for shortness of breath.   Cardiovascular: Negative for chest pain, palpitations and leg swelling.  Gastrointestinal: See HPI.  Musculoskeletal: Negative for back pain or muscle aches.  Neurological: Negative for dizziness, headaches or paresthesias.   Physical Exam:  General: 77 year old female in no acute distress. Head: Normocephalic and atraumatic. Eyes: No scleral icterus. Conjunctiva pink . Ears: Normal auditory acuity. Mouth: Dentition intact. No ulcers or lesions.  Lungs: Clear throughout to auscultation. Heart: Regular rate and rhythm, no murmur. Abdomen: Soft, nondistended. Mild tenderness throughout the lower abdomen without rebound or guarding. No masses or hepatomegaly. Normal bowel sounds x 4 quadrants.  Rectal: Deferred.  Musculoskeletal: Symmetrical with no gross deformities. Extremities: No edema. Neurological: Alert oriented x 4. No focal deficits.  Psychological: Alert and cooperative. Normal  mood and affect  Assessment and Recommendations:  77 year old female with diverticular disease with persistent lower abdominal pain which recurred 09/17/2022 and progressively worsened on 09/21/2022 with associated constipation and vomiting x 1.  Temp 100.6 on 3/6. She continues to have intermittent daily lower abdominal pain which she believes is similar to the abdominal pain she experienced when diagnosed with diverticulitis in 2017. -CBC, CMP -Cipro 500 mg 1 p.o. twice daily for 7 days and Flagyl 500 mg 1 p.o. twice daily for 7 days -Schedule CTAP in next few days if her abdominal pain persists or worsens.  Patient instructed to go to the emergency room if she develops severe abdominal pain. -Patient to contact Dr. Leone Payor with an update on Monday 09/28/2022  IBS, previously diarrhea predominant. Variable bowel pattern with small solid stools and loose stools with nonbloody mucous.  -Check C. Diff PCR  Recent UTI treated with antibiotics, repeat UA negative as reported by the patient

## 2022-09-24 NOTE — Telephone Encounter (Signed)
Dr. Carlean Purl Pt. Please review prior notes below.  Contacted pt this AM in regard to my chart message. Pt stated that she had a fever again last night, Still having abdominal pain when walking, having a BM. Pt states that when she is very still then it is not as bad. Voiding fine now, no issues with urinating. 3 BM yesterday. ( 2 regular and 1 diarrhea) 1 formed BM today. Please advise as DOD.

## 2022-09-24 NOTE — Patient Instructions (Addendum)
_______________________________________________________  If your blood pressure at your visit was 140/90 or greater, please contact your primary care physician to follow up on this.  _______________________________________________________  If you are age 76 or older, your body mass index should be between 23-30. Your Body mass index is 26.28 kg/m. If this is out of the aforementioned range listed, please consider follow up with your Primary Care Provider.  If you are age 16 or younger, your body mass index should be between 19-25. Your Body mass index is 26.28 kg/m. If this is out of the aformentioned range listed, please consider follow up with your Primary Care Provider.   ________________________________________________________  The [place] [facility] providers would like to encourage you to use [facility] to communicate with providers for non-urgent requests or questions.  Due to long hold times on the telephone, sending your provider a message by [facility] may be a faster and more efficient way to get a response.  Please allow 48 business hours for a response.  Please remember that this is for non-urgent requests.  _______________________________________________________  Your provider has requested that you go to the basement level for lab work before leaving today. Press "B" on the elevator. The lab is located at the first door on the left as you exit the elevator.  Contact Dr. Carlean Purl with an update on Monday , go to the emergency room if you develop severe abdominal pain

## 2022-09-24 NOTE — Telephone Encounter (Signed)
Pt stated that her fever is usually in the evenings around 8:30 /9:00 PM. Last night she started feeling warm, checked her temp and it was 99.6. and took Tylenol. Lower abdominal pain, below the waste line, mid abdomen that radiates from left to right. Pt states that she does have a history of diverticulitis and is concerned that this may be the problem. Pt  scheduled to see Carl Best NP today at 3:30 PM. Pt made aware.  Pt verbalized understanding with all questions answered.

## 2022-09-24 NOTE — Telephone Encounter (Signed)
Spoke to pt. Phone note created

## 2022-09-28 ENCOUNTER — Encounter: Payer: Self-pay | Admitting: Internal Medicine

## 2022-10-02 ENCOUNTER — Ambulatory Visit
Admission: RE | Admit: 2022-10-02 | Discharge: 2022-10-02 | Disposition: A | Discharge status: Home / Self Care | Payer: Medicare HMO | Source: Ambulatory Visit | Attending: Endocrinology | Admitting: Endocrinology

## 2022-10-02 DIAGNOSIS — Z1231 Encounter for screening mammogram for malignant neoplasm of breast: Secondary | ICD-10-CM

## 2022-10-07 DIAGNOSIS — E785 Hyperlipidemia, unspecified: Secondary | ICD-10-CM | POA: Diagnosis not present

## 2022-10-07 DIAGNOSIS — I10 Essential (primary) hypertension: Secondary | ICD-10-CM | POA: Diagnosis not present

## 2022-10-07 DIAGNOSIS — M8580 Other specified disorders of bone density and structure, unspecified site: Secondary | ICD-10-CM | POA: Diagnosis not present

## 2022-10-07 DIAGNOSIS — E1142 Type 2 diabetes mellitus with diabetic polyneuropathy: Secondary | ICD-10-CM | POA: Diagnosis not present

## 2022-10-07 DIAGNOSIS — R7989 Other specified abnormal findings of blood chemistry: Secondary | ICD-10-CM | POA: Diagnosis not present

## 2022-10-07 DIAGNOSIS — E049 Nontoxic goiter, unspecified: Secondary | ICD-10-CM | POA: Diagnosis not present

## 2022-10-14 DIAGNOSIS — N6019 Diffuse cystic mastopathy of unspecified breast: Secondary | ICD-10-CM | POA: Diagnosis not present

## 2022-10-14 DIAGNOSIS — Z0000 Encounter for general adult medical examination without abnormal findings: Secondary | ICD-10-CM | POA: Diagnosis not present

## 2022-10-14 DIAGNOSIS — I2510 Atherosclerotic heart disease of native coronary artery without angina pectoris: Secondary | ICD-10-CM | POA: Diagnosis not present

## 2022-10-14 DIAGNOSIS — Z1339 Encounter for screening examination for other mental health and behavioral disorders: Secondary | ICD-10-CM | POA: Diagnosis not present

## 2022-10-14 DIAGNOSIS — M545 Low back pain, unspecified: Secondary | ICD-10-CM | POA: Diagnosis not present

## 2022-10-14 DIAGNOSIS — G63 Polyneuropathy in diseases classified elsewhere: Secondary | ICD-10-CM | POA: Diagnosis not present

## 2022-10-14 DIAGNOSIS — K580 Irritable bowel syndrome with diarrhea: Secondary | ICD-10-CM | POA: Diagnosis not present

## 2022-10-14 DIAGNOSIS — M8580 Other specified disorders of bone density and structure, unspecified site: Secondary | ICD-10-CM | POA: Diagnosis not present

## 2022-10-14 DIAGNOSIS — E1142 Type 2 diabetes mellitus with diabetic polyneuropathy: Secondary | ICD-10-CM | POA: Diagnosis not present

## 2022-10-14 DIAGNOSIS — Z1331 Encounter for screening for depression: Secondary | ICD-10-CM | POA: Diagnosis not present

## 2022-10-24 DIAGNOSIS — L309 Dermatitis, unspecified: Secondary | ICD-10-CM | POA: Diagnosis not present

## 2022-11-02 ENCOUNTER — Ambulatory Visit: Payer: Medicare HMO | Attending: Cardiology | Admitting: Cardiology

## 2022-11-02 ENCOUNTER — Encounter: Payer: Self-pay | Admitting: Cardiology

## 2022-11-02 VITALS — BP 118/70 | HR 100 | Ht 63.0 in | Wt 147.0 lb

## 2022-11-02 DIAGNOSIS — I10 Essential (primary) hypertension: Secondary | ICD-10-CM | POA: Diagnosis not present

## 2022-11-02 DIAGNOSIS — R002 Palpitations: Secondary | ICD-10-CM

## 2022-11-02 DIAGNOSIS — E781 Pure hyperglyceridemia: Secondary | ICD-10-CM

## 2022-11-02 DIAGNOSIS — R079 Chest pain, unspecified: Secondary | ICD-10-CM | POA: Diagnosis not present

## 2022-11-02 NOTE — Progress Notes (Signed)
Primary Care Provider: [name], MD [facility] Cardiologist: Bryan Lemma, MD Electrophysiologist: None Gastroenterologist: Dr. Nell Range  Clinic Note: Chief Complaint  Patient presents with   New Patient (Initial Visit)    Last seen in 2018.  Here to reestablish cardiology care.  Concerned about palpitations/heart issues; family history   ===================================  ASSESSMENT/PLAN   Problem List Items Addressed This Visit       Cardiology Problems   Hypertriglyceridemia (Chronic)    Managed by Dr. Seymour Bars.  She has been on fenofibrate but no longer.  Her atorvastatin actually has been on hold for a few weeks to see if there is any change in her cramping or myalgias.      Essential hypertension (Chronic)    Blood pressure pretty well-controlled.  She is on lisinopril and HCTZ combination.  Reasonable given her diabetes.        Other   Palpitations - Primary    Palpitations are relatively rare/infrequent.  Maybe 2 of the Episodes, noted on Apple Watch (but also occasionally with Kardia-Mobile her husband's phone.  I mentioned that it is okay to use for both of them as well as there is a way to document it was being checked.  I suspect that all she is having is PACs because she is not describing any sustained arrhythmias.  She is tachycardic today but is quite stressed.  On my reassessment her heart rate was better.  At home her heart rates average in the 70s.  Continue to monitor with the Apple Watch for signs of arrhythmia. Recommend adequate hydration, 60 to 70 ounces of water a day. Recommended relaxation techniques for biofeedback to try to work on slowing heart rate down and control anxiety.      Relevant Orders   EKG 12-Lead (Completed)   Chest pain with low risk for cardiac etiology    Previously evaluated with a coronary CTA that was relatively normal.  Chest pain this time there is 2 episodes occurring at rest with no other associated  symptoms and no follow on symptoms with exertion.  Unlikely be cardiac in nature.  Musculoskeletal, doing by stress.  Could also will have been related to diverticulitis.      Monitor for symptoms.  Discussed that the management.  Would not recommend any testing at this timeframe.  Continue to monitor.  Will try to see her back in 6 months to see how she is doing. ===================================  HPI:    Michelle Spears is a 77 y.o. female with PMH notable for DM-2, hypertriglyceridemia, GERD, diverticulosis, IBS (predominantly diarrhea) and chronic arthritis along with Family History of Premature CAD (previously evaluated with relatively normal Coronary CTA) who is being seen today for the evaluation of "Concern for Heart Issues" at the request of [name], MD. (I think this was actually a self-referral.  I last saw Maryruth Bun in August 2018 follow-up with Coronary CTA.  She had been referred by Dr. Sedalia Muta for left-sided chest pain.  She had epigastric pain radiating to her collarbone and neck like a balloon expanding in her chest.  Coronary CTA showed minimal disease but also showed hepatic steatosis and liver cyst.  Chest pain felt to be noncardiac in nature.  Recent Hospitalizations / Procedures Colonoscopy on February 4 02/06/2023: Severe diverticulosis of the sigmoid colon and narrowing of the colon associated with diverticular opening.  Internal hemorrhoids noted. 09/16/2022-had recurrent lower abdominal pain from diverticular disease [place] progressively worse until September 21, 2022.  Associated with nausea vomiting and constipation.  Temp of 100.6.  => Seen by Dr. Concha Se from GI-was treated with Cipro 500 mg twice daily for 7 days and Flagyl 500 mg twice daily for 7 days. => Plan was for follow-up CT abdomen pelvis if pain worsens.  Reviewed  CV studies:    The following studies were reviewed today: (if available, images/films reviewed: From Epic Chart or Care Everywhere) No new  studies.  Coronary CTA reviewed below.:  Interval History:   BRADLEY HANDYSIDE presents here today for cardiology evaluation accompanied by her husband who provides most of the ad lib. data.  She has been concerned about some episodes happening since February.  She awoke at roughly 11 PM started feeling really cold and had some chest discomfort going to the left arm lasting for about a minute or so.  It was a tight squeezing sensation but no other symptoms of dyspnea or lightheadedness or wooziness.  She went back to sleep and symptoms resolved.  2 weeks later on March 14 she again had chest discomfort this time lasting for about 10 minutes while sitting in her desk.  During these intermittent moles, her husband used his Kardia-Mobile and checked to see if there is anything going on.  There were some PACs noted but no arrhythmia noted.  Other than those 2 spells she has not had any real chest pain to speak of especially no exertional chest pain she has been trying to do more active walking about a mile to track with her husband.  She does have occasional sensation of palpitations but nothing prolonged and no longer abnormal heart rhythm episodes.  She has felt her heart rate going fast when she felt the discomfort in her chest those 2 episodes.  But not associate with any syncope or near syncope.  No TIA or emesis fugax.  She has had some vertigo but notes to dizziness from arrhythmias.  She states that some of the palpitation episodes may have been related to her having to take in some prednisone for a rash.  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 In addition to her neuropathy she has nighttime leg cramps. Her heart rate is fast today, but she says at home is not that fast.  Her husband indicates that on his checks it usually is in the 70s.  CV Review of Symptoms (Summary):  positive for - chest pain, palpitations, and rapid heart rate negative for -  dyspnea on exertion, irregular heartbeat, orthopnea, paroxysmal nocturnal dyspnea, shortness of breath, or syncope or near syncope, TIA or amaurosis fugax, claudication  REVIEWED OF SYSTEMS   Review of Systems  Constitutional:  Positive for malaise/fatigue (Somewhat deconditioned, but still improving.  Walking more.). Negative for weight loss.  Respiratory: Negative.    Gastrointestinal:  Positive for abdominal pain, diarrhea and heartburn. Negative for blood in stool and melena.  Musculoskeletal:  Positive for joint pain and myalgias (Nighttime cramps down the calfs rating to the feet). Negative for falls.  Neurological:  Positive for dizziness (Vertigo symptoms.). Negative for weakness and headaches.  Psychiatric/Behavioral:  Positive for memory loss (Mild). Negative for depression. The patient is nervous/anxious (Husband thinks that the chest pain episodes described are related to stress). The patient does not have insomnia.     I have reviewed and (if needed) personally updated the patient's problem list, medications, allergies, past medical and surgical history, social and family history.   PAST MEDICAL  HISTORY   Past Medical History:  Diagnosis Date   Allergy    Arthritis    Diabetes mellitus without complication    Diverticulosis 2007   Essential hypertension    GERD (gastroesophageal reflux disease)    Glucose intolerance    /Prediabetes -> most recent hemoglobin A1c was 6.6   Headache(784.0)    Hepatic cyst    History of hysterectomy    nonmalignant reasons   Hypertriglyceridemia    Internal hemorrhoids    Kidney stone 1975/2004   PMS (premenstrual syndrome)    Seasonal allergies    Spastic colon    Thyroid goiter    hx of    PAST SURGICAL HISTORY   Past Surgical History:  Procedure Laterality Date   ABDOMINAL HYSTERECTOMY     bladder stent     for stones   bladder stent removal     BREAST BIOPSY Right    2018   childbirthx2     COLONOSCOPY  2013   CT CTA  CORONARY W/CA SCORE W/CM &/OR WO/CM  01/2017   Coronary Calcium Score 3.  Minimal nonobstructive CAD.   Right dominant. Normal PA.   GALLBLADDER SURGERY  12/2014   KIDNEY STONE SURGERY  1975   TEMPOROMANDIBULAR JOINT SURGERY  1995   TONSILLECTOMY     TOTAL ABDOMINAL HYSTERECTOMY W/ BILATERAL SALPINGOOPHORECTOMY  1985   WRIST SURGERY  2017    MEDICATIONS/ALLERGIES   Current Meds  Medication Sig   Ascorbic Acid (VITAMIN C) 1000 MG tablet Take 1,000 mg by mouth daily.   aspirin EC 81 MG tablet Take 81 mg by mouth daily.   atorvastatin (LIPITOR) 10 MG tablet Take 10 mg by mouth daily.   Calcium Carb-Cholecalciferol (CALTRATE 600+D3) 600-800 MG-UNIT TABS daily.   cetirizine (ZYRTEC) 10 MG tablet TAKE 1-2 TIMES PER DAY FOR ITCHING   fluticasone (FLONASE) 50 MCG/ACT nasal spray Place 1 spray into both nostrils daily as needed.    gabapentin (NEURONTIN) 300 MG capsule Take 300 mg by mouth 2 (two) times daily as needed.   glucose blood test strip One touch verio, test once daily, Dx 250.00   lisinopril-hydrochlorothiazide (ZESTORETIC) 10-12.5 MG tablet take one by mouth once a day   loperamide (IMODIUM A-D) 2 MG tablet Take 2 mg by mouth as needed for diarrhea or loose stools.   meclizine (ANTIVERT) 25 MG tablet Take 25 mg by mouth as needed.   metFORMIN (GLUCOPHAGE) 500 MG tablet Take by mouth 2 (two) times daily with a meal.   Multiple Vitamins-Minerals (CENTRUM SILVER PO) Take 1 tablet by mouth daily.   Omega-3 Fatty Acids (FISH OIL) 1200 MG CAPS Take 1 capsule by mouth daily.   omeprazole (PRILOSEC) 20 MG capsule Take 20 mg by mouth every morning.   POTASSIUM PO Take 650 mg by mouth daily.    No Known Allergies  SOCIAL HISTORY/FAMILY HISTORY   Reviewed in Epic:   Social History   Tobacco Use   Smoking status: Former    Packs/day: 0.30    Years: 12.00    Additional pack years: 0.00    Total pack years: 3.60    Types: Cigarettes    Quit date: 07/20/2004    Years since quitting:  18.3   Smokeless tobacco: Never  Vaping Use   Vaping Use: Never used  Substance Use Topics   Alcohol use: Yes    Alcohol/week: 0.0 standard drinks of alcohol    Comment: occ.    Drug use: No  Social History   Social History Narrative   She lives in [place] with her spouse. They have 2 children and 4 grandchildren.   She quit smoking in 2006. Does not drink alcohol.   She works out 3 days a week for 1 1/2 hr with a workout program known as "shapes" - however that Rosanne Ashing is checked down, and she is now having to find a new program. 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   Family History  Problem Relation Age of Onset   Hyperlipidemia Mother        Has chronic back pain   Obesity Father    Heart attack Father 29       Died from "massive MI "   Hypertension Father    Multiple sclerosis Sister    Diabetes Mellitus II Brother    High Cholesterol Brother    Heart attack Paternal Grandfather 57   Hyperlipidemia Other        siblings   CAD Other        Several paternal uncles and aunts all with CAD before age 52   Colon cancer Neg Hx    Colon polyps Neg Hx    Esophageal cancer Neg Hx    Rectal cancer Neg Hx    Stomach cancer Neg Hx     OBJCTIVE -PE, EKG, labs   Wt Readings from Last 3 Encounters:  11/02/22 147 lb (66.7 kg)  09/24/22 148 lb 6 oz (67.3 kg)  09/08/22 154 lb (69.9 kg)    Physical Exam: BP 118/70   Pulse 100   Ht  (1.6 m)   Wt 147 lb (66.7 kg)   SpO2 96%   BMI 26.04 kg/m  Physical Exam Vitals reviewed.  Constitutional:      General: She is not in acute distress.    Appearance: Normal appearance. She is normal weight. She is not ill-appearing or toxic-appearing.     Comments: Well-nourished and well-groomed.  Healthy-appearing.  HENT:     Head: Normocephalic and atraumatic.     Comments: Wears glasses. Neck:     Vascular: No carotid bruit.  Cardiovascular:     Rate and Rhythm:  Normal rate and regular rhythm.     Pulses: Normal pulses.     Heart sounds: Normal heart sounds. No murmur heard.    No friction rub. No gallop.  Pulmonary:     Effort: Pulmonary effort is normal. No respiratory distress.     Breath sounds: Normal breath sounds. No wheezing, rhonchi or rales.  Chest:     Chest wall: Tenderness present.  Abdominal:     General: Bowel sounds are normal. There is no distension.     Palpations: Abdomen is soft. There is no mass.     Tenderness: There is no abdominal tenderness.     Comments: No HSM or bruit  Musculoskeletal:        General: No swelling. Normal range of motion.     Cervical back: Normal range of motion and neck supple.  Skin:    General: Skin is warm and dry.     Coloration: Skin is not jaundiced or pale.  Neurological:     General: No focal deficit present.     Mental Status: She is alert and oriented to person, place, and time.     Cranial Nerves: No cranial nerve deficit.  Psychiatric:  Mood and Affect: Mood normal.        Behavior: Behavior normal.        Thought Content: Thought content normal.        Judgment: Judgment normal.     Comments: Seems very anxious and subdued     Adult ECG Report  Rate: 100 ;  Rhythm: sinus tachycardia and nonspecific ST and T wave changes.  Otherwise normal axis, intervals durations. ;   Narrative Interpretation: Stable  Recent Labs: December 2023 Na+ 140, K+ 3.6, Cl- 100, HCO3-29, BUN 24, Cr 0.75, Glu 155, Ca2+ 9.6; AST 17, ALT 13, AlkP 48 CBC: W 8.3, Hgb 12.8, Plt 223 Not available. 10/07/2022: TC 86, TG 218, HDL 22, LDL 20; A1c 5.7   ================================================== I spent a total of 28  minutes with the patient spent in direct patient consultation.  Additional time spent with chart review  / charting (studies, outside notes, etc): 19 min Total Time: 47 min  Current medicines are reviewed at length with the patient today.  (+/- concerns) N/A  Notice: This  dictation was prepared with Dragon dictation along with smart phrase technology. Any transcriptional errors that result from this process are unintentional and may not be corrected upon review.   Studies Ordered:  Orders Placed This Encounter  Procedures   EKG 12-Lead   No orders of the defined types were placed in this encounter.   Patient Instructions / Medication Changes & Studies & Tests Ordered   Patient Instructions  Medication Instructions:   No changes *If you need a refill on your cardiac medications before your next appointment, please call your pharmacy*   Lab Work: Not needed    Testing/Procedures:  Not needed  Follow-Up: At [facility], you and your health needs are our priority.  As part of our continuing mission to provide you with exceptional heart care, we have created designated Provider Care Teams.  These Care Teams include your primary Cardiologist (physician) and Advanced Practice Providers (APPs -  Physician Assistants and Nurse Practitioners) who all work together to provide you with the care you need, when you need it.     Your next appointment:   6 month(s)  The format for your next appointment:   In Person  Provider:   Bryan Lemma, MD    Other Instructions    Continue to monitor your heart rate with your  Apple watch   Keep hydrated   Try some Relaxation technique     Marykay Lex, MD, MS Bryan Lemma, M.D., M.S. Interventional Cardiologist  [facility]  Pager # [phone] Phone # [phone] [address] [place], [place] [address]   Thank you for choosing [facility] at [place]!!

## 2022-11-02 NOTE — Patient Instructions (Addendum)
Medication Instructions:   No changes *If you need a refill on your cardiac medications before your next appointment, please call your pharmacy*   Lab Work: Not needed    Testing/Procedures:  Not needed  Follow-Up: At [facility], you and your health needs are our priority.  As part of our continuing mission to provide you with exceptional heart care, we have created designated Provider Care Teams.  These Care Teams include your primary Cardiologist (physician) and Advanced Practice Providers (APPs -  Physician Assistants and Nurse Practitioners) who all work together to provide you with the care you need, when you need it.     Your next appointment:   6 month(s)  The format for your next appointment:   In Person  Provider:   Bryan Lemma, MD    Other Instructions    Continue to monitor your heart rate with your  Apple watch   Keep hydrated   Try some Relaxation technique

## 2022-11-08 ENCOUNTER — Encounter: Payer: Self-pay | Admitting: Cardiology

## 2022-11-08 DIAGNOSIS — R002 Palpitations: Secondary | ICD-10-CM | POA: Insufficient documentation

## 2022-11-08 NOTE — Assessment & Plan Note (Signed)
Relatively reassuring coronary calcium score in 2018 with a current calcium score of 3 and minimal obstructive disease.  Likely Coronary Calcium Score Michelle Spears increased, but she is not necessarily having true cardiac symptoms.  She had 2 episodes of chest discomfort at rest and no exertional symptoms going forward.  Unlikely be cardiac in nature.  May very well be musculoskeletal or stress related.  Her most recent lipids showed elevated triglycerides but very low LDL and HDL with total cholesterol of 86.  Unlikely that she would have developed significant coronary disease with this level of control.

## 2022-11-08 NOTE — Assessment & Plan Note (Signed)
Managed by Dr. Seymour Bars.  She has been on fenofibrate but no longer.  Her atorvastatin actually has been on hold for a few weeks to see if there is any change in her cramping or myalgias.

## 2022-11-08 NOTE — Assessment & Plan Note (Signed)
Blood pressure pretty well-controlled.  She is on lisinopril and HCTZ combination.  Reasonable given her diabetes.

## 2022-11-08 NOTE — Assessment & Plan Note (Signed)
Previously evaluated with a coronary CTA that was relatively normal.  Chest pain this time there is 2 episodes occurring at rest with no other associated symptoms and no follow on symptoms with exertion.  Unlikely be cardiac in nature.  Musculoskeletal, doing by stress.  Could also will have been related to diverticulitis.

## 2022-11-08 NOTE — Assessment & Plan Note (Signed)
Palpitations are relatively rare/infrequent.  Maybe 2 of the Episodes, noted on Apple Watch (but also occasionally with Kardia-Mobile her husband's phone.  I mentioned that it is okay to use for both of them as well as there is a way to document it was being checked.  I suspect that all she is having is PACs because she is not describing any sustained arrhythmias.  She is tachycardic today but is quite stressed.  On my reassessment her heart rate was better.  At home her heart rates average in the 70s.  Continue to monitor with the Apple Watch for signs of arrhythmia. Recommend adequate hydration, 60 to 70 ounces of water a day. Recommended relaxation techniques for biofeedback to try to work on slowing heart rate down and control anxiety.

## 2022-11-11 DIAGNOSIS — L820 Inflamed seborrheic keratosis: Secondary | ICD-10-CM | POA: Diagnosis not present

## 2022-11-11 DIAGNOSIS — D1801 Hemangioma of skin and subcutaneous tissue: Secondary | ICD-10-CM | POA: Diagnosis not present

## 2022-11-11 DIAGNOSIS — L812 Freckles: Secondary | ICD-10-CM | POA: Diagnosis not present

## 2022-11-11 DIAGNOSIS — L821 Other seborrheic keratosis: Secondary | ICD-10-CM | POA: Diagnosis not present

## 2022-12-03 DIAGNOSIS — E119 Type 2 diabetes mellitus without complications: Secondary | ICD-10-CM | POA: Diagnosis not present

## 2022-12-07 DIAGNOSIS — J3089 Other allergic rhinitis: Secondary | ICD-10-CM | POA: Diagnosis not present

## 2022-12-07 DIAGNOSIS — H1045 Other chronic allergic conjunctivitis: Secondary | ICD-10-CM | POA: Diagnosis not present

## 2022-12-07 DIAGNOSIS — R052 Subacute cough: Secondary | ICD-10-CM | POA: Diagnosis not present

## 2022-12-07 DIAGNOSIS — R21 Rash and other nonspecific skin eruption: Secondary | ICD-10-CM | POA: Diagnosis not present

## 2022-12-11 DIAGNOSIS — K5792 Diverticulitis of intestine, part unspecified, without perforation or abscess without bleeding: Secondary | ICD-10-CM | POA: Diagnosis not present

## 2022-12-11 DIAGNOSIS — I10 Essential (primary) hypertension: Secondary | ICD-10-CM | POA: Diagnosis not present

## 2022-12-11 DIAGNOSIS — R102 Pelvic and perineal pain: Secondary | ICD-10-CM | POA: Diagnosis not present

## 2022-12-11 DIAGNOSIS — K5790 Diverticulosis of intestine, part unspecified, without perforation or abscess without bleeding: Secondary | ICD-10-CM | POA: Diagnosis not present

## 2022-12-11 DIAGNOSIS — K580 Irritable bowel syndrome with diarrhea: Secondary | ICD-10-CM | POA: Diagnosis not present

## 2022-12-15 ENCOUNTER — Telehealth: Payer: Self-pay | Admitting: Internal Medicine

## 2022-12-15 NOTE — Telephone Encounter (Signed)
Pt stated that she is being treated by her PCP for diverticulitis and is on antibiotics. Pt stated that her PCP requested that she reach out and schedule an appointment with her GI Dr. Due to this is the third episode since January.  Pt was scheduled for an office visit on 01/28/2023 at 1:50 PM with Dr. Leone Payor  Pt made aware.  Pt verbalized understanding with all questions answered.

## 2022-12-15 NOTE — Telephone Encounter (Signed)
Patient called states she has diverticulitis and is seeking advise.

## 2022-12-20 ENCOUNTER — Encounter: Payer: Self-pay | Admitting: Internal Medicine

## 2022-12-20 DIAGNOSIS — L270 Generalized skin eruption due to drugs and medicaments taken internally: Secondary | ICD-10-CM | POA: Insufficient documentation

## 2022-12-20 HISTORY — DX: Generalized skin eruption due to drugs and medicaments taken internally: L27.0

## 2022-12-24 ENCOUNTER — Encounter: Payer: Self-pay | Admitting: Endocrinology

## 2022-12-25 ENCOUNTER — Other Ambulatory Visit: Payer: Self-pay | Admitting: Endocrinology

## 2022-12-25 ENCOUNTER — Encounter: Payer: Self-pay | Admitting: Endocrinology

## 2022-12-25 DIAGNOSIS — R1032 Left lower quadrant pain: Secondary | ICD-10-CM

## 2022-12-30 ENCOUNTER — Ambulatory Visit
Admission: RE | Admit: 2022-12-30 | Discharge: 2022-12-30 | Disposition: A | Discharge status: Home / Self Care | Payer: Medicare HMO | Source: Ambulatory Visit | Attending: Endocrinology | Admitting: Endocrinology

## 2022-12-30 DIAGNOSIS — K5730 Diverticulosis of large intestine without perforation or abscess without bleeding: Secondary | ICD-10-CM | POA: Diagnosis not present

## 2022-12-30 DIAGNOSIS — R1032 Left lower quadrant pain: Secondary | ICD-10-CM

## 2022-12-30 DIAGNOSIS — D72829 Elevated white blood cell count, unspecified: Secondary | ICD-10-CM | POA: Diagnosis not present

## 2022-12-30 DIAGNOSIS — R509 Fever, unspecified: Secondary | ICD-10-CM | POA: Diagnosis not present

## 2022-12-30 DIAGNOSIS — R140 Abdominal distension (gaseous): Secondary | ICD-10-CM | POA: Diagnosis not present

## 2022-12-30 DIAGNOSIS — R197 Diarrhea, unspecified: Secondary | ICD-10-CM | POA: Diagnosis not present

## 2022-12-30 MED ORDER — IOPAMIDOL (ISOVUE-300) INJECTION 61%
100.0000 mL | Freq: Once | INTRAVENOUS | Status: AC | PRN
  Administered 2022-12-30: 100 mL via INTRAVENOUS

## 2023-01-28 ENCOUNTER — Encounter: Payer: Self-pay | Admitting: Internal Medicine

## 2023-01-28 ENCOUNTER — Telehealth: Payer: Self-pay | Admitting: Internal Medicine

## 2023-01-28 ENCOUNTER — Ambulatory Visit: Payer: Medicare HMO | Admitting: Internal Medicine

## 2023-01-28 VITALS — BP 138/70 | HR 79 | Wt 148.0 lb

## 2023-01-28 DIAGNOSIS — K5732 Diverticulitis of large intestine without perforation or abscess without bleeding: Secondary | ICD-10-CM | POA: Diagnosis not present

## 2023-01-28 DIAGNOSIS — K862 Cyst of pancreas: Secondary | ICD-10-CM

## 2023-01-28 DIAGNOSIS — K56699 Other intestinal obstruction unspecified as to partial versus complete obstruction: Secondary | ICD-10-CM

## 2023-01-28 DIAGNOSIS — K580 Irritable bowel syndrome with diarrhea: Secondary | ICD-10-CM | POA: Diagnosis not present

## 2023-01-28 DIAGNOSIS — R197 Diarrhea, unspecified: Secondary | ICD-10-CM

## 2023-01-28 NOTE — Patient Instructions (Signed)
We will place a referral to [facility] for you to see Dr Maisie Fus. Their phone # is [phone]. They are located at : [address] [place] [address].  Consider trying [place]. Handout provided.  Consider trying 1-2 Imodium every day.  Safe travels to [place].  I appreciate the opportunity to care for you. Stan Head, MD, [facility]

## 2023-01-28 NOTE — Telephone Encounter (Signed)
Need to contact her and explained that she has some pancreatic cyst and we should consider pancreatic elastase and repeat MRI in 6 months

## 2023-01-28 NOTE — Progress Notes (Signed)
Michelle Spears 77 y.o. 1946-06-05 [id number]  Assessment & Plan:   Encounter Diagnoses  Name Primary?   Diverticulitis of colon - recurrent, suspected Yes   Diverticular stricture (HCC)    Irritable bowel syndrome with diarrhea    Pancreatic cyst    Clinically it sounds like she has recurrent diverticulitis.  At least in 2016 she had CT findings consistent with diverticulitis.  In the background is a chronic IBS-diarrhea predominant syndrome.  Perhaps it is symptomatic diverticulosis.  She does have severe diverticulosis with colonic narrowing.  I.e. stricture.  This could respond to segmental resection if the patient and surgeon think it is appropriate.  I have had patients do this in the past.  Sometimes we find occult diverticulitis.  There was no suggestion of colitis associated with diverticulosis on colonoscopy.  I will refer her to Dr. Maisie Fus of colorectal surgery.  She can try Imodium daily versus as needed and consider trying low FODMAP diet and or FODZYME supplement for the IBS.  I think she may have some rapid transit at times and sees a capsule in stool which could be probiotic (which I told her is not necessary)  Regarding the pancreatic cysts, I did not focus on these today.  We will most likely schedule an MRI for 6 months from now.  These have been there chronically and are reported to be slightly larger so I think they are  benign and not a risk.  However MRI could shed more light.  We will contact the patient about this.  It does also raise the question of possible pancreatic insufficiency though her history is not classic for that, we can check fecal elastase.  Celiac disease has been ruled out with negative blood work earlier this year.  CC: [name], MD Dr. [name]  Subjective:   Chief Complaint: Diverticulitis symptoms, chronic diarrhea issues  HPI 77 year old white woman with chronic recurrent diarrhea problems and left lower quadrant pain  episodes thought to be diverticulitis but only shown on CT scanning once that I know, 2016 in [place].  She is here with her husband today.  She had returned to my attention this year with complaints of diarrhea, after a screening colonoscopy in February 2024 demonstrated severe diverticulosis in the sigmoid colon with colonic narrowing.  She came back to the office that same month and had the following history:   Defecation history is that in the morning she will have a stool that is formed but "breaks out when it is in the water). Then she will have multiple bowel movements over the next several hours all much looser and then watery. Imodium A-D is helpful. She may take up to 3 in a day. She will take 2 and then 1 more and that stops the diarrhea and she does not get severe constipation. She has not eliminated lactose but she switched to almond milk and that did not help. She eats "a lot of fruit" and she knows that is a trigger. She is taking a pill a probiotic.   Since that time she has reduced fruit.  She continues to use Imodium A-D.  She says 3 days out of 5 she will have these diarrhea problems.  She said she has had it her whole life.  She has also had a couple of episodes of left lower quadrant pain and low-grade fever, she saw [name] in our office on 76/01/2023 and CBC and CMET were okay except for some hyperglycemia,  she was treated with 1 week of Cipro and metronidazole.  Then in May she had problems again and she saw primary care and received 2 weeks of Cipro and metronidazole.  While taking antibiotics she has gotten some hives and there was a question of allergy though she saw Dr. [name] of allergy and it was not clear to him that it was a true medication allergy as follows:  "We had a long discussion regarding the differential diagnosis of rash. The timing of symptoms is not consistent with an immediate hypersensitivity reaction. Symptoms might represent a delayed  hypersensitivity reaction (type IV hypersensitivity), although the timing is also very delayed. We reviewed that there is not a good skin test or lab test to confirm or deny type IV hypersensitivity reactions to medications. We reviewed that there are many other potential causes for maculopapular rash. The patient was instructed to avoid Flagyl and fluoroquinolones for now; if absolutely neccessary, the risks and benefits of Flagyl and fluroquinolones must be carefully considered by the prescriber and the patient."  She continues to have her chronic low-grade left lower quadrant discomfort, it will hurt when she defecates often, but it is better and unlike the episodes in March and May that were suspected to be diverticulitis.  She had a CT scan again in June while she felt okay other than this low-grade discomfort which is chronic and it did not show diverticulitis.  She also sees ? Capsule in stool at times and brought a photo     CT 2016 [place] IMPRESSION: Sigmoid diverticulosis with inflammatory changes around the sigmoid colon compatible with active diverticulitis. No complicating feature at this time.     Electronically Signed By: [name] M.D. On: 08/01/2014 13:02   CT abdomen pelvis with contrast 12/30/2022 is reviewed personally  IMPRESSION: Colonic diverticulosis, without radiographic evidence of diverticulitis. No other acute findings identified.   Tiny nonobstructing right renal calculus. No evidence of ureteral calculi or hydronephrosis.   Pelvic floor laxity with small cystocele.   Small cystic lesions in pancreatic body and tail, mildly increased since previous exam in 2016. 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 Recommend continued imaging follow-up with abdomen MRI without and with contrast in 6 months. This recommendation follows ACR consensus guidelines: Management of Incidental Pancreatic Cysts: A White Paper of the ACR  Incidental Findings Committee. J Am Coll Radiol 2017;14:911-923.   Aortic Atherosclerosis (ICD10-I70.0).     Electronically Signed   By: [name] M.D.   On: 12/30/2022 12:16    March - tx Cipro and Metronidazole x 7 d (CKS)   colonoscopy 08/26/2022 which showed severe diverticulosis in the sigmoid colon with associated narrowing, no polyps.  No further colonoscopies recommended due to age.     No Known Allergies Current Meds  Medication Sig   Ascorbic Acid (VITAMIN C) 1000 MG tablet Take 1,000 mg by mouth daily.   aspirin EC 81 MG tablet Take 81 mg by mouth daily.   Calcium Carb-Cholecalciferol (CALTRATE 600+D3) 600-800 MG-UNIT TABS daily.   cetirizine (ZYRTEC) 10 MG tablet TAKE 1-2 TIMES PER DAY FOR ITCHING   fluticasone (FLONASE) 50 MCG/ACT nasal spray Place 1 spray into both nostrils daily as needed.    gabapentin (NEURONTIN) 300 MG capsule Take 300 mg by mouth 2 (two) times daily as needed.   glucose blood test strip One touch verio, test once daily, Dx 250.00   lisinopril-hydrochlorothiazide (ZESTORETIC) 10-12.5 MG tablet take one by mouth once a day  loperamide (IMODIUM A-D) 2 MG tablet Take 2 mg by mouth as needed for diarrhea or loose stools.   meclizine (ANTIVERT) 25 MG tablet Take 25 mg by mouth as needed.   metFORMIN (GLUCOPHAGE) 500 MG tablet Take by mouth 2 (two) times daily with a meal.   Multiple Vitamins-Minerals (CENTRUM SILVER PO) Take 1 tablet by mouth daily.   Omega-3 Fatty Acids (FISH OIL) 1200 MG CAPS Take 1 capsule by mouth daily.   omeprazole (PRILOSEC) 20 MG capsule Take 20 mg by mouth every morning.   POTASSIUM PO Take 650 mg by mouth daily.   Past Medical History:  Diagnosis Date   Allergy    Arthritis    Diabetes mellitus without complication (HCC)    Diverticulosis 2007   Essential hypertension    GERD (gastroesophageal reflux disease)    Glucose intolerance    /Prediabetes -> most recent hemoglobin A1c was 6.6   Headache(784.0)     Hepatic cyst    History of hysterectomy    nonmalignant reasons   Hypertriglyceridemia    Internal hemorrhoids    Kidney stone 1975/2004   PMS (premenstrual syndrome)    Possible Drug rash fromn cipro and/or metronidazole 12/20/2022   Allergy eval - not clear- delayed rash after cipro and MTZ - avoid if possible   Seasonal allergies    Spastic colon    Thyroid goiter    hx of   Past Surgical History:  Procedure Laterality Date   ABDOMINAL HYSTERECTOMY     bladder stent     for stones   bladder stent removal     BREAST BIOPSY Right    2018   childbirthx2     COLONOSCOPY  2013   CT CTA CORONARY W/CA SCORE W/CM &/OR WO/CM  01/2017   Coronary Calcium Score 3.  Minimal nonobstructive CAD.   Right dominant. Normal PA.   GALLBLADDER SURGERY  12/2014   KIDNEY STONE SURGERY  1975   TEMPOROMANDIBULAR JOINT SURGERY  1995   TONSILLECTOMY     TOTAL ABDOMINAL HYSTERECTOMY W/ BILATERAL SALPINGOOPHORECTOMY  1985   WRIST SURGERY  2017   Social History   Social History Narrative   She lives in [place] with her spouse. They have 2 children and 4 grandchildren.   She quit smoking in 2006. Does not drink alcohol.   She works out 3 days a week for 1 1/2 hr with a workout program known as "[employer]" - however that Rosanne Ashing is checked down, and she is now having to find a new program. 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   family history includes CAD in an other family member; Diabetes Mellitus II in her brother; Heart attack (age of onset: 2) in her father; Heart attack (age of onset: 69) in her paternal grandfather; High Cholesterol in her brother; Hyperlipidemia in her mother and another family member; Hypertension in her father; Multiple sclerosis in her sister; Obesity in her father.   Review of Systems As per HPI  Objective:   Physical Exam BP 138/70   Pulse 79   Wt 148 lb (67.1 kg)   BMI 26.22 kg/m  Abd soft mild-mod  LLQ tenderness

## 2023-02-11 NOTE — Telephone Encounter (Signed)
I spoke with Michelle Spears and she will come next Monday to pick up the stool kit. She said she is seeing the surgeon on Monday July 29th. I told her when it gets closer to December we can set up the MRI.

## 2023-02-11 NOTE — Telephone Encounter (Signed)
Now that patient is back from vacation please contact her and explain that while she was here the other week I forgot to discuss a cyst that was seen on her CT scan.  There is a cyst in the pancreas.  I recommend the following:  #1 schedule or put a reminder for MRI to be done in December 2024 to take pictures of the pancreas cyst again.  That would be MRI with and without contrast.  We want to make sure it is not growing.  We think it is benign but want to be sure.  #2 order stool fecal elastase regarding her diarrhea, this is something else I thought of after the visit and it checks on the function of the pancreas and that could be contributing to her diarrhea or loose stool problem.  Encounter Diagnoses  Name Primary?   Diarrhea, unspecified type Yes   Pancreatic cyst

## 2023-02-12 DIAGNOSIS — M25552 Pain in left hip: Secondary | ICD-10-CM | POA: Diagnosis not present

## 2023-02-12 DIAGNOSIS — M25561 Pain in right knee: Secondary | ICD-10-CM | POA: Diagnosis not present

## 2023-02-12 DIAGNOSIS — M25551 Pain in right hip: Secondary | ICD-10-CM | POA: Diagnosis not present

## 2023-02-12 DIAGNOSIS — G8929 Other chronic pain: Secondary | ICD-10-CM | POA: Diagnosis not present

## 2023-02-15 ENCOUNTER — Ambulatory Visit: Payer: Self-pay | Admitting: General Surgery

## 2023-02-15 DIAGNOSIS — K56699 Other intestinal obstruction unspecified as to partial versus complete obstruction: Secondary | ICD-10-CM | POA: Diagnosis not present

## 2023-02-15 NOTE — H&P (Signed)
REFERRING PHYSICIAN:  Iva Boop, MD  PROVIDER:  Elenora Gamma, MD  MRN: [id number] DOB: 17-Jul-1946 DATE OF ENCOUNTER: 02/15/2023  Subjective   Chief Complaint: New Consultation     History of Present Illness: Jolette Berkland is a 77 y.o. female who is seen today as an office consultation at the request of Dr. Leone Payor for evaluation of New Consultation .  77 year old female who presents to the office due to chronic recurrent diarrhea and left lower quadrant pain.  This is thought to have been diverticulitis but CT scans have only shown this once in 2016.  She underwent a screening colonoscopy in February of this year which demonstrated severe diverticulosis in the sigmoid colon with narrowing.  She has tried changing her diet and diarrhea medications but continues to have episodes of pain.   Review of Systems: A complete review of systems was obtained from the patient.  I have reviewed this information and discussed as appropriate with the patient.  See HPI as well for other ROS.   Medical History: Past Medical History:  Diagnosis Date   Arthritis    Diabetes mellitus without complication (CMS/HHS-HCC)    GERD (gastroesophageal reflux disease)    Hypertension     There is no problem list on file for this patient.   Past Surgical History:  Procedure Laterality Date   Hand surgery  2018   CHOLECYSTECTOMY     HYSTERECTOMY       No Known Allergies  Current Outpatient Medications on File Prior to Visit  Medication Sig Dispense Refill   ascorbic acid, vitamin C, (VITAMIN C) 1000 MG tablet Take 1,000 mg by mouth once daily     aspirin 81 MG EC tablet Take 81 mg by mouth once daily     cetirizine (ZYRTEC) 10 MG tablet Take 10 mg by mouth once daily     gabapentin (NEURONTIN) 300 MG capsule Take by mouth     lisinopriL-hydroCHLOROthiazide (ZESTORETIC) 10-12.5 mg tablet      loperamide (IMODIUM A-D) 2 mg tablet Take by mouth     meloxicam (MOBIC) 15 MG tablet  Take 15 mg by mouth once daily     metFORMIN (GLUCOPHAGE-XR) 500 MG XR tablet Take 500 mg by mouth 2 (two) times daily     omeprazole (PRILOSEC) 20 MG DR capsule Take 20 mg by mouth once daily     potassium iodide 65 mg Tab Take by mouth     DOCOSAHEXAENOIC ACID ORAL Take 1 g by mouth once daily (Patient not taking: Reported on 02/15/2023)     No current facility-administered medications on file prior to visit.    Family History  Problem Relation Age of Onset   Skin cancer Mother    Hyperlipidemia (Elevated cholesterol) Mother    Obesity Father    Coronary Artery Disease (Blocked arteries around heart) Father    Hyperlipidemia (Elevated cholesterol) Sister    Diabetes Brother      Social History   Tobacco Use  Smoking Status Former   Types: Cigarettes   Start date: 2006  Smokeless Tobacco Never     Social History   Socioeconomic History   Marital status: Married  Tobacco Use   Smoking status: Former    Types: Cigarettes    Start date: 2006   Smokeless tobacco: Never  Substance and Sexual Activity   Alcohol use: Never   Drug use: Never    Objective:    Vitals:   02/15/23 1452 02/15/23 1453  BP: 128/78   Pulse: 100   Temp: 36.7 C (98.1 F)   SpO2: 93%   Weight: 67.2 kg (148 lb 3.2 oz)   Height: 160 cm (5\' 3" )   PainSc:  0-No pain  PainLoc:  Abdomen     Exam Gen: NAD CV: RRR Lungs: CTA Abd: soft    Labs, Imaging and Diagnostic Testing:   Assessment and Plan:  Diagnoses and all orders for this visit:  Diverticular stricture (CMS/HHS-HCC) -     polyethylene glycol (MIRALAX) powder; Take 233.75 g by mouth once for 1 dose Take according to your procedure prep instructions. -     bisacodyL (DULCOLAX) 5 mg EC tablet; Take 4 tablets (20 mg total) by mouth once daily as needed for Constipation for up to 1 dose -     metroNIDAZOLE (FLAGYL) 500 MG tablet; Take 2 tablets (1,000 mg total) by mouth 3 (three) times daily for 3 doses Take according to your  procedure colon prep instructions -     neomycin 500 mg tablet; Take 2 tablets (1,000 mg total) by mouth 3 (three) times daily for 3 doses Take according to your procedure colon prep instructions    77 year old female with worsening diarrhea over the past year.  Colonoscopy relatively underwhelming.  She does have a diverticular stricture and significant diverticulosis on colonoscopy as well as CT scan.  She has had at least 1 and possibly 2 episodes of CT proven diverticulitis in the past.  She has tried managing her diarrhea with medications and dietary changes.  She has had no significant relief in her symptoms.  We discussed today that possibly removing her diverticular stricture may make her symptoms improved.  We discussed that there is a possibility that they could also make them worse.  I believe she is inclined to try the surgery and see if this will help with her symptoms.  She is working on managing some hip pain as well and will follow-up with a doctor later this week for this.  Once she has this information, she will be able to determine when to schedule surgery. The surgery and anatomy were described to the patient as well as the risks of surgery and the possible complications.  These include: Bleeding, deep abdominal infections and possible wound complications such as hernia and infection, damage to adjacent structures, leak of surgical connections, which can lead to other surgeries and possibly an ostomy, possible need for other procedures, such as abscess drains in radiology, possible prolonged hospital stay, possible diarrhea from removal of part of the colon, possible constipation from narcotics, possible bowel, bladder or sexual dysfunction if having rectal surgery, prolonged fatigue/weakness or appetite loss, possible early recurrence of of disease, possible complications of their medical problems such as heart disease or arrhythmias or lung problems, death (less than 1%). I believe the  patient understands and wishes to proceed with the surgery.  Vanita Panda, MD Colon and Rectal Surgery [facility]

## 2023-02-19 DIAGNOSIS — M7062 Trochanteric bursitis, left hip: Secondary | ICD-10-CM | POA: Diagnosis not present

## 2023-02-19 DIAGNOSIS — M7061 Trochanteric bursitis, right hip: Secondary | ICD-10-CM | POA: Diagnosis not present

## 2023-02-19 DIAGNOSIS — M170 Bilateral primary osteoarthritis of knee: Secondary | ICD-10-CM | POA: Diagnosis not present

## 2023-02-23 ENCOUNTER — Ambulatory Visit: Payer: Medicare HMO

## 2023-02-23 DIAGNOSIS — I2510 Atherosclerotic heart disease of native coronary artery without angina pectoris: Secondary | ICD-10-CM | POA: Diagnosis not present

## 2023-02-23 DIAGNOSIS — K5792 Diverticulitis of intestine, part unspecified, without perforation or abscess without bleeding: Secondary | ICD-10-CM | POA: Diagnosis not present

## 2023-02-23 DIAGNOSIS — G63 Polyneuropathy in diseases classified elsewhere: Secondary | ICD-10-CM | POA: Diagnosis not present

## 2023-02-23 DIAGNOSIS — K862 Cyst of pancreas: Secondary | ICD-10-CM | POA: Diagnosis not present

## 2023-02-23 DIAGNOSIS — I700 Atherosclerosis of aorta: Secondary | ICD-10-CM | POA: Diagnosis not present

## 2023-02-23 DIAGNOSIS — E1142 Type 2 diabetes mellitus with diabetic polyneuropathy: Secondary | ICD-10-CM | POA: Diagnosis not present

## 2023-02-23 DIAGNOSIS — I10 Essential (primary) hypertension: Secondary | ICD-10-CM | POA: Diagnosis not present

## 2023-03-05 ENCOUNTER — Other Ambulatory Visit: Payer: Medicare HMO

## 2023-03-05 DIAGNOSIS — K862 Cyst of pancreas: Secondary | ICD-10-CM | POA: Diagnosis not present

## 2023-03-05 DIAGNOSIS — R197 Diarrhea, unspecified: Secondary | ICD-10-CM

## 2023-03-11 LAB — PANCREATIC ELASTASE, FECAL: Pancreatic Elastase-1, Stool: >500 ug/g

## 2023-03-29 DIAGNOSIS — M1990 Unspecified osteoarthritis, unspecified site: Secondary | ICD-10-CM | POA: Diagnosis not present

## 2023-03-29 DIAGNOSIS — N189 Chronic kidney disease, unspecified: Secondary | ICD-10-CM | POA: Diagnosis not present

## 2023-03-29 DIAGNOSIS — I2510 Atherosclerotic heart disease of native coronary artery without angina pectoris: Secondary | ICD-10-CM | POA: Diagnosis not present

## 2023-03-29 DIAGNOSIS — M8580 Other specified disorders of bone density and structure, unspecified site: Secondary | ICD-10-CM | POA: Diagnosis not present

## 2023-03-29 DIAGNOSIS — J309 Allergic rhinitis, unspecified: Secondary | ICD-10-CM | POA: Diagnosis not present

## 2023-03-29 DIAGNOSIS — E785 Hyperlipidemia, unspecified: Secondary | ICD-10-CM | POA: Diagnosis not present

## 2023-03-29 DIAGNOSIS — I129 Hypertensive chronic kidney disease with stage 1 through stage 4 chronic kidney disease, or unspecified chronic kidney disease: Secondary | ICD-10-CM | POA: Diagnosis not present

## 2023-03-29 DIAGNOSIS — E1122 Type 2 diabetes mellitus with diabetic chronic kidney disease: Secondary | ICD-10-CM | POA: Diagnosis not present

## 2023-03-29 DIAGNOSIS — K219 Gastro-esophageal reflux disease without esophagitis: Secondary | ICD-10-CM | POA: Diagnosis not present

## 2023-03-29 DIAGNOSIS — E1142 Type 2 diabetes mellitus with diabetic polyneuropathy: Secondary | ICD-10-CM | POA: Diagnosis not present

## 2023-03-29 DIAGNOSIS — K589 Irritable bowel syndrome without diarrhea: Secondary | ICD-10-CM | POA: Diagnosis not present

## 2023-03-29 DIAGNOSIS — M5440 Lumbago with sciatica, unspecified side: Secondary | ICD-10-CM | POA: Diagnosis not present

## 2023-04-06 DIAGNOSIS — E119 Type 2 diabetes mellitus without complications: Secondary | ICD-10-CM | POA: Diagnosis not present

## 2023-04-06 NOTE — Patient Instructions (Signed)
SURGICAL WAITING ROOM VISITATION  Patients having surgery or a procedure may have no more than 2 support people in the waiting area - these visitors may rotate.    Children under the age of 67 must have an adult with them who is not the patient.  Due to an increase in RSV and influenza rates and associated hospitalizations, children ages 66 and under may not visit patients in [facility] hospitals.  If the patient needs to stay at the hospital during part of their recovery, the visitor guidelines for inpatient rooms apply. Pre-op nurse will coordinate an appropriate time for 1 support person to accompany patient in pre-op.  This support person may not rotate.    Please refer to the [facility] website for the visitor guidelines for Inpatients (after your surgery is over and you are in a regular room).    Your procedure is scheduled on: 04/21/23   Report to [facility] Main Entrance    Report to admitting at 10:45 AM   Call this number if you have problems the morning of surgery [phone]   Follow a clear liquid diet the day before surgery.   You may have the following liquids until 10:00 AM DAY OF SURGERY  Water Non-Citrus Juices (without pulp, NO RED-Apple, White grape, White cranberry) Black Coffee (NO MILK/CREAM OR CREAMERS, sugar ok)  Clear Tea (NO MILK/CREAM OR CREAMERS, sugar ok) regular and decaf                             Plain Jell-O (NO RED)                                           Fruit ices (not with fruit pulp, NO RED)                                     Popsicles (NO RED)                                                               Sports drinks like Gatorade (NO RED)              Drink 2 G2 drinks AT 10:00 PM the night before surgery.        The day of surgery:  Drink ONE (1) Pre-Surgery G2 at 10:00 AM the morning of surgery. Drink in one sitting. Do not sip.  This drink was given to you during your hospital  pre-op appointment visit. Nothing else  to drink after completing the  Pre-Surgery G2.          If you have questions, please contact your surgeon's office.   FOLLOW BOWEL PREP AND ANY ADDITIONAL PRE OP INSTRUCTIONS YOU RECEIVED FROM YOUR SURGEON'S OFFICE!!!     Oral Hygiene is also important to reduce your risk of infection.                                    Remember -  BRUSH YOUR TEETH THE MORNING OF SURGERY WITH YOUR REGULAR TOOTHPASTE  DENTURES WILL BE REMOVED PRIOR TO SURGERY PLEASE DO NOT APPLY "Poly grip" OR ADHESIVES!!!   Stop all vitamins and herbal supplements 7 days before surgery.   Take these medicines the morning of surgery with A SIP OF WATER: Tylenol, Atorvastatin, Zyrtec, Flonase, Meclizine, Omeprazole   DO NOT TAKE ANY ORAL DIABETIC MEDICATIONS DAY OF YOUR SURGERY  How to Manage Your Diabetes Before and After Surgery  Why is it important to control my blood sugar before and after surgery? Improving blood sugar levels before and after surgery helps healing and can limit problems. A way of improving blood sugar control is eating a healthy diet by:  Eating less sugar and carbohydrates  Increasing activity/exercise  Talking with your doctor about reaching your blood sugar goals High blood sugars (greater than 180 mg/dL) can raise your risk of infections and slow your recovery, so you will need to focus on controlling your diabetes during the weeks before surgery. Make sure that the doctor who takes care of your diabetes knows about your planned surgery including the date and location.  How do I manage my blood sugar before surgery? Check your blood sugar at least 4 times a day, starting 2 days before surgery, to make sure that the level is not too high or low. Check your blood sugar the morning of your surgery when you wake up and every 2 hours until you get to the Short Stay unit. If your blood sugar is less than 70 mg/dL, you will need to treat for low blood sugar: Do not take insulin. Treat a low blood  sugar (less than 70 mg/dL) with  cup of clear juice (cranberry or apple), 4 glucose tablets, OR glucose gel. Recheck blood sugar in 15 minutes after treatment (to make sure it is greater than 70 mg/dL). If your blood sugar is not greater than 70 mg/dL on recheck, call [phone] for further instructions. Report your blood sugar to the short stay nurse when you get to Short Stay.  If you are admitted to the hospital after surgery: Your blood sugar will be checked by the staff and you will probably be given insulin after surgery (instead of oral diabetes medicines) to make sure you have good blood sugar levels. The goal for blood sugar control after surgery is 80-180 mg/dL.   WHAT DO I DO ABOUT MY DIABETES MEDICATION?  Do not take oral diabetes medicines (pills) the morning of surgery.  THE DAY BEFORE SURGERY, take Metformin as prescribed.     THE MORNING OF SURGERY, do not take Metformin.   Reviewed and Endorsed by [facility] Patient Education Committee, August 2015                              You may not have any metal on your body including hair pins, jewelry, and body piercing             Do not wear make-up, lotions, powders, perfumes, or deodorant  Do not wear nail polish including gel and S&S, artificial/acrylic nails, or any other type of covering on natural nails including finger and toenails. If you have artificial nails, gel coating, etc. that needs to be removed by a nail salon please have this removed prior to surgery or surgery may need to be canceled/ delayed if the surgeon/ anesthesia feels like they are unable to be safely monitored.  Do not shave  48 hours prior to surgery.    Do not bring valuables to the hospital. [place] IS NOT             RESPONSIBLE   FOR VALUABLES.   Contacts, glasses, dentures or bridgework may not be worn into surgery.   Bring small overnight bag day of surgery.   DO NOT BRING YOUR HOME MEDICATIONS TO THE HOSPITAL. PHARMACY WILL  DISPENSE MEDICATIONS LISTED ON YOUR MEDICATION LIST TO YOU DURING YOUR ADMISSION IN THE HOSPITAL!    Special Instructions: Bring a copy of your healthcare power of attorney and living will documents the day of surgery if you haven't scanned them before.              Please read over the following fact sheets you were given: IF YOU HAVE QUESTIONS ABOUT YOUR PRE-OP INSTRUCTIONS PLEASE CALL [phone] [place]ras    If you received a COVID test during your pre-op visit  it is requested that you wear a mask when out in public, stay away from anyone that may not be feeling well and notify your surgeon if you develop symptoms. If you test positive for Covid or have been in contact with anyone that has tested positive in the last 10 days please notify you surgeon.    [place] - Preparing for Surgery Before surgery, you can play an important role.  Because skin is not sterile, your skin needs to be as free of germs as possible.  You can reduce the number of germs on your skin by washing with CHG (chlorahexidine gluconate) soap before surgery.  CHG is an antiseptic cleaner which kills germs and bonds with the skin to continue killing germs even after washing. Please DO NOT use if you have an allergy to CHG or antibacterial soaps.  If your skin becomes reddened/irritated stop using the CHG and inform your nurse when you arrive at Short Stay. Do not shave (including legs and underarms) for at least 48 hours prior to the first CHG shower.  You may shave your face/neck.  Please follow these instructions carefully:  1.  Shower with CHG Soap the night before surgery and the  morning of surgery.  2.  If you choose to wash your hair, wash your hair first as usual with your normal  shampoo.  3.  After you shampoo, rinse your hair and body thoroughly to remove the shampoo.                             4.  Use CHG as you would any other liquid soap.  You can apply chg directly to the skin and wash.  Gently with a  scrungie or clean washcloth.  5.  Apply the CHG Soap to your body ONLY FROM THE NECK DOWN.   Do   not use on face/ open                           Wound or open sores. Avoid contact with eyes, ears mouth and   genitals (private parts).                       Wash face,  Genitals (private parts) with your normal soap.             6.  Wash thoroughly, paying special attention to the area where your  surgery  will be performed.  7.  Thoroughly rinse your body with warm water from the neck down.  8.  DO NOT shower/wash with your normal soap after using and rinsing off the CHG Soap.                9.  Pat yourself dry with a clean towel.            10.  Wear clean pajamas.            11.  Place clean sheets on your bed the night of your first shower and do not  sleep with pets. Day of Surgery : Do not apply any lotions/deodorants the morning of surgery.  Please wear clean clothes to the hospital/surgery center.  FAILURE TO FOLLOW THESE INSTRUCTIONS MAY RESULT IN THE CANCELLATION OF YOUR SURGERY  PATIENT SIGNATURE_________________________________  NURSE SIGNATURE__________________________________  ________________________________________________________________________  Michelle Spears  An incentive spirometer is a tool that can help keep your lungs clear and active. This tool measures how well you are filling your lungs with each breath. Taking long deep breaths may help reverse or decrease the chance of developing breathing (pulmonary) problems (especially infection) following: A long period of time when you are unable to move or be active. BEFORE THE PROCEDURE  If the spirometer includes an indicator to show your best effort, your nurse or respiratory therapist will set it to a desired goal. If possible, sit up straight or lean slightly forward. Try not to slouch. Hold the incentive spirometer in an upright position. INSTRUCTIONS FOR USE  Sit on the edge of your bed if possible, or  sit up as far as you can in bed or on a chair. Hold the incentive spirometer in an upright position. Breathe out normally. Place the mouthpiece in your mouth and seal your lips tightly around it. Breathe in slowly and as deeply as possible, raising the piston or the ball toward the top of the column. Hold your breath for 3-5 seconds or for as long as possible. Allow the piston or ball to fall to the bottom of the column. Remove the mouthpiece from your mouth and breathe out normally. Rest for a few seconds and repeat Steps 1 through 7 at least 10 times every 1-2 hours when you are awake. Take your time and take a few normal breaths between deep breaths. The spirometer may include an indicator to show your best effort. Use the indicator as a goal to work toward during each repetition. After each set of 10 deep breaths, practice coughing to be sure your lungs are clear. If you have an incision (the cut made at the time of surgery), support your incision when coughing by placing a pillow or rolled up towels firmly against it. Once you are able to get out of bed, walk around indoors and cough well. You may stop using the incentive spirometer when instructed by your caregiver.  RISKS AND COMPLICATIONS Take your time so you do not get dizzy or light-headed. If you are in pain, you may need to take or ask for pain medication before doing incentive spirometry. It is harder to take a deep breath if you are having pain. AFTER USE Rest and breathe slowly and easily. It can be helpful to keep track of a log of your progress. Your caregiver can provide you with a simple table to help with this. If you are using the spirometer at home, follow these instructions: SEEK MEDICAL CARE IF:  You  are having difficultly using the spirometer. You have trouble using the spirometer as often as instructed. Your pain medication is not giving enough relief while using the spirometer. You develop fever of 100.5 F (38.1 C)  or higher. SEEK IMMEDIATE MEDICAL CARE IF:  You cough up bloody sputum that had not been present before. You develop fever of 102 F (38.9 C) or greater. You develop worsening pain at or near the incision site. MAKE SURE YOU:  Understand these instructions. Will watch your condition. Will get help right away if you are not doing well or get worse. Document Released: 11/16/2006 Document Revised: 09/28/2011 Document Reviewed: 01/17/2007 Exit[employer] Patient Information 2014 [employer], [place].   ________________________________________________________________________ WHAT IS A BLOOD TRANSFUSION? Blood Transfusion Information  A transfusion is the replacement of blood or some of its parts. Blood is made up of multiple cells which provide different functions. Red blood cells carry oxygen and are used for blood loss replacement. White blood cells fight against infection. Platelets control bleeding. Plasma helps clot blood. Other blood products are available for specialized needs, such as hemophilia or other clotting disorders. BEFORE THE TRANSFUSION  Who gives blood for transfusions?  Healthy volunteers who are fully evaluated to make sure their blood is safe. This is blood bank blood. Transfusion therapy is the safest it has ever been in the practice of medicine. Before blood is taken from a donor, a complete history is taken to make sure that person has no history of diseases nor engages in risky social behavior (examples are intravenous drug use or sexual activity with multiple partners). The donor's travel history is screened to minimize risk of transmitting infections, such as malaria. The donated blood is tested for signs of infectious diseases, such as HIV and hepatitis. The blood is then tested to be sure it is compatible with you in order to minimize the chance of a transfusion reaction. If you or a relative donates blood, this is often done in anticipation of surgery and is not appropriate  for emergency situations. It takes many days to process the donated blood. RISKS AND COMPLICATIONS Although transfusion therapy is very safe and saves many lives, the main dangers of transfusion include:  Getting an infectious disease. Developing a transfusion reaction. This is an allergic reaction to something in the blood you were given. Every precaution is taken to prevent this. The decision to have a blood transfusion has been considered carefully by your caregiver before blood is given. Blood is not given unless the benefits outweigh the risks. AFTER THE TRANSFUSION Right after receiving a blood transfusion, you will usually feel much better and more energetic. This is especially true if your red blood cells have gotten low (anemic). The transfusion raises the level of the red blood cells which carry oxygen, and this usually causes an energy increase. The nurse administering the transfusion will monitor you carefully for complications. HOME CARE INSTRUCTIONS  No special instructions are needed after a transfusion. You may find your energy is better. Speak with your caregiver about any limitations on activity for underlying diseases you may have. SEEK MEDICAL CARE IF:  Your condition is not improving after your transfusion. You develop redness or irritation at the intravenous (IV) site. SEEK IMMEDIATE MEDICAL CARE IF:  Any of the following symptoms occur over the next 12 hours: Shaking chills. You have a temperature by mouth above 102 F (38.9 C), not controlled by medicine. Chest, back, or muscle pain. People around you feel you are not acting correctly  or are confused. Shortness of breath or difficulty breathing. Dizziness and fainting. You get a rash or develop hives. You have a decrease in urine output. Your urine turns a dark color or changes to pink, red, or brown. Any of the following symptoms occur over the next 10 days: You have a temperature by mouth above 102 F (38.9 C),  not controlled by medicine. Shortness of breath. Weakness after normal activity. The white part of the eye turns yellow (jaundice). You have a decrease in the amount of urine or are urinating less often. Your urine turns a dark color or changes to pink, red, or brown. Document Released: 07/03/2000 Document Revised: 09/28/2011 Document Reviewed: 02/20/2008 [facility] Patient Information 2014 [place], [place].  _______________________________________________________________________

## 2023-04-06 NOTE — Progress Notes (Signed)
COVID Vaccine Completed:  Date of COVID positive in last 90 days:  PCP - [name], MD Cardiologist - Bryan Lemma, MD LOV 11/02/22  Chest x-ray -  EKG - 11/02/22 Epic Stress Test -  ECHO -  Cardiac Cath -  Pacemaker/ICD device last checked: Spinal Cord Stimulator:  Bowel Prep -   Sleep Study -  CPAP -   Fasting Blood Sugar -  Checks Blood Sugar _____ times a day  Last dose of GLP1 agonist-  N/A GLP1 instructions:  N/A   Last dose of SGLT-2 inhibitors-  N/A SGLT-2 instructions: N/A   Blood Thinner Instructions:  Time Aspirin Instructions: ASA 81 Last Dose:  Activity level:  Can go up a flight of stairs and perform activities of daily living without stopping and without symptoms of chest pain or shortness of breath.  Able to exercise without symptoms  Unable to go up a flight of stairs without symptoms of     Anesthesia review: HTN, CAD, DM2, palpitations  Patient denies shortness of breath, fever, cough and chest pain at PAT appointment  Patient verbalized understanding of instructions that were given to them at the PAT appointment. Patient was also instructed that they will need to review over the PAT instructions again at home before surgery.

## 2023-04-08 ENCOUNTER — Encounter (HOSPITAL_COMMUNITY)
Admission: RE | Admit: 2023-04-08 | Discharge: 2023-04-08 | Disposition: A | Discharge status: Home / Self Care | Payer: Medicare HMO | Source: Ambulatory Visit | Attending: General Surgery | Admitting: General Surgery

## 2023-04-08 ENCOUNTER — Other Ambulatory Visit: Payer: Self-pay

## 2023-04-08 ENCOUNTER — Encounter (HOSPITAL_COMMUNITY): Payer: Self-pay

## 2023-04-08 VITALS — BP 155/78 | HR 71 | Temp 98.6°F | Resp 16 | Ht 63.0 in | Wt 141.8 lb

## 2023-04-08 DIAGNOSIS — Z01818 Encounter for other preprocedural examination: Secondary | ICD-10-CM

## 2023-04-08 DIAGNOSIS — Z01812 Encounter for preprocedural laboratory examination: Secondary | ICD-10-CM | POA: Diagnosis not present

## 2023-04-08 DIAGNOSIS — K769 Liver disease, unspecified: Secondary | ICD-10-CM | POA: Insufficient documentation

## 2023-04-08 DIAGNOSIS — E119 Type 2 diabetes mellitus without complications: Secondary | ICD-10-CM | POA: Diagnosis not present

## 2023-04-08 HISTORY — DX: Other specified postprocedural states: Z98.890

## 2023-04-08 HISTORY — DX: Other specified postprocedural states: R11.2

## 2023-04-08 LAB — COMPREHENSIVE METABOLIC PANEL
ALT: 34 U/L (ref 0–44)
AST: 32 U/L (ref 15–41)
Albumin: 3.7 g/dL (ref 3.5–5.0)
Alkaline Phosphatase: 36 U/L — ABNORMAL LOW (ref 38–126)
Anion gap: 9 (ref 5–15)
BUN: 15 mg/dL (ref 8–23)
CO2: 27 mmol/L (ref 22–32)
Calcium: 9 mg/dL (ref 8.9–10.3)
Chloride: 102 mmol/L (ref 98–111)
Creatinine, Ser: 0.66 mg/dL (ref 0.44–1.00)
GFR, Estimated: >60 mL/min (ref >60)
Glucose, Bld: 176 mg/dL — ABNORMAL HIGH (ref 70–99)
Potassium: 3.9 mmol/L (ref 3.5–5.1)
Sodium: 138 mmol/L (ref 135–145)
Total Bilirubin: 0.8 mg/dL (ref 0.3–1.2)
Total Protein: 6.6 g/dL (ref 6.5–8.1)

## 2023-04-08 LAB — CBC
HCT: 38.5 % (ref 36.0–46.0)
Hemoglobin: 13 g/dL (ref 12.0–15.0)
MCH: 32.5 pg (ref 26.0–34.0)
MCHC: 33.8 g/dL (ref 30.0–36.0)
MCV: 96.3 fL (ref 80.0–100.0)
Platelets: 167 10*3/uL (ref 150–400)
RBC: 4 MIL/uL (ref 3.87–5.11)
RDW: 13.4 % (ref 11.5–15.5)
WBC: 5.2 10*3/uL (ref 4.0–10.5)
nRBC: 0 % (ref 0.0–0.2)

## 2023-04-08 LAB — GLUCOSE, CAPILLARY: Glucose-Capillary: 169 mg/dL — ABNORMAL HIGH (ref 70–99)

## 2023-04-08 LAB — TYPE AND SCREEN
ABO/RH(D): O POS
Antibody Screen: NEGATIVE

## 2023-04-08 LAB — HEMOGLOBIN A1C
Hgb A1c MFr Bld: 5.5 % (ref 4.8–5.6)
Mean Plasma Glucose: 111.15 mg/dL

## 2023-04-14 NOTE — Progress Notes (Signed)
Anesthesia Chart Review   Case: [id number] Date/Time: 04/21/23 1245   Procedure: XI ROBOT ASSISTED LAPAROSCOPIC PARTIAL COLECTOMY - 2.5 HOURS   Anesthesia type: General   Pre-op diagnosis: COLONIC STRICTURE   Location: [facility] ROOM 02 / [facility]   Surgeons: [name], MD       DISCUSSION:77 y.o. former smoker with h/o PONV, HTN, DM II, prediabetes, colonic stricture scheduled for above procedure 04/21/23 with Dr. [name].   Pt has been evaluated by cardiology for palpitations and chest pain.  She was last seen by cardiology 11/02/2022.  Per note palpitations rare, PACs likely.  Pt advised to continue to monitor.  In regards to chest pain pt evaluated with coronary CTA that was relatively normal, unlikely cardiac in nature per notes. No further testing ordered at this time.   VS: BP (!) 155/78   Pulse 71   Temp 37 C (Oral)   Resp 16   Ht 5\' 3"  (1.6 m)   Wt 64.3 kg   SpO2 97%   BMI 25.12 kg/m   PROVIDERS: [name], MD is PCP   Bryan Lemma, MD is Cardiologist  LABS: Labs reviewed: Acceptable for surgery. (all labs ordered are listed, but only abnormal results are displayed)  Labs Reviewed  COMPREHENSIVE METABOLIC PANEL - Abnormal; Notable for the following components:      Result Value   Glucose, Bld 176 (*)    Alkaline Phosphatase 36 (*)    All other components within normal limits  GLUCOSE, CAPILLARY - Abnormal; Notable for the following components:   Glucose-Capillary 169 (*)    All other components within normal limits  HEMOGLOBIN A1C  CBC  TYPE AND SCREEN     IMAGES:   EKG:   CV:  Past Medical History:  Diagnosis Date   Allergy    Arthritis    Diabetes mellitus without complication (HCC)    Diverticulosis 2007   Essential hypertension    GERD (gastroesophageal reflux disease)    Glucose intolerance    /Prediabetes -> most recent hemoglobin A1c was 6.6   Headache(784.0)    Hepatic cyst    History of hysterectomy    nonmalignant reasons    Hypertriglyceridemia    Internal hemorrhoids    Kidney stone 1975/2004   PMS (premenstrual syndrome)    PONV (postoperative nausea and vomiting)    Possible Drug rash fromn cipro and/or metronidazole 12/20/2022   Allergy eval - not clear- delayed rash after cipro and MTZ - avoid if possible   Seasonal allergies    Spastic colon    Thyroid goiter    hx of    Past Surgical History:  Procedure Laterality Date   ABDOMINAL HYSTERECTOMY     bladder stent     for stones   bladder stent removal     BREAST BIOPSY Right    2018   childbirthx2     COLONOSCOPY  2013   CT CTA CORONARY W/CA SCORE W/CM &/OR WO/CM  01/2017   Coronary Calcium Score 3.  Minimal nonobstructive CAD.   Right dominant. Normal PA.   GALLBLADDER SURGERY  12/2014   KIDNEY STONE SURGERY  1975   TEMPOROMANDIBULAR JOINT SURGERY  1995   TONSILLECTOMY     TOTAL ABDOMINAL HYSTERECTOMY W/ BILATERAL SALPINGOOPHORECTOMY  1985   WRIST SURGERY  2017    MEDICATIONS:  acetaminophen (TYLENOL) 325 MG tablet   Ascorbic Acid (VITAMIN C) 1000 MG tablet   aspirin EC 81 MG tablet   atorvastatin (LIPITOR) 10  MG tablet   Calcium Carb-Cholecalciferol (CALCIUM 600 + D PO)   cetirizine (ZYRTEC) 10 MG tablet   fluticasone (FLONASE) 50 MCG/ACT nasal spray   glucose blood test strip   lisinopril-hydrochlorothiazide (ZESTORETIC) 10-12.5 MG tablet   loperamide (IMODIUM A-D) 2 MG tablet   meclizine (ANTIVERT) 25 MG tablet   meloxicam (MOBIC) 15 MG tablet   metFORMIN (GLUCOPHAGE) 500 MG tablet   Multiple Vitamins-Minerals (CENTRUM SILVER PO)   Omega 3 1000 MG CAPS   omeprazole (PRILOSEC) 20 MG capsule   POTASSIUM PO   No current facility-administered medications for this encounter.    Jodell Cipro Ward, PA-C [facility]-Surgical [facility] [phone]

## 2023-04-21 ENCOUNTER — Encounter (HOSPITAL_COMMUNITY): Payer: Self-pay | Admitting: General Surgery

## 2023-04-21 ENCOUNTER — Inpatient Hospital Stay (HOSPITAL_COMMUNITY): Payer: Medicare HMO | Admitting: Physician Assistant

## 2023-04-21 ENCOUNTER — Encounter (HOSPITAL_COMMUNITY)
Admission: RE | Disposition: A | Discharge status: Home / Self Care | Payer: Self-pay | Source: Ambulatory Visit | Attending: General Surgery

## 2023-04-21 ENCOUNTER — Inpatient Hospital Stay (HOSPITAL_COMMUNITY): Payer: Medicare HMO

## 2023-04-21 ENCOUNTER — Other Ambulatory Visit: Payer: Self-pay

## 2023-04-21 ENCOUNTER — Inpatient Hospital Stay (HOSPITAL_COMMUNITY)
Admission: RE | Admit: 2023-04-21 | Discharge: 2023-04-23 | DRG: 330 | Disposition: A | Discharge status: Home / Self Care | Payer: Medicare HMO | Source: Ambulatory Visit | Attending: General Surgery | Admitting: General Surgery

## 2023-04-21 DIAGNOSIS — K219 Gastro-esophageal reflux disease without esophagitis: Secondary | ICD-10-CM | POA: Diagnosis present

## 2023-04-21 DIAGNOSIS — K5730 Diverticulosis of large intestine without perforation or abscess without bleeding: Secondary | ICD-10-CM | POA: Diagnosis not present

## 2023-04-21 DIAGNOSIS — Z79899 Other long term (current) drug therapy: Secondary | ICD-10-CM | POA: Diagnosis not present

## 2023-04-21 DIAGNOSIS — Z90710 Acquired absence of both cervix and uterus: Secondary | ICD-10-CM | POA: Diagnosis not present

## 2023-04-21 DIAGNOSIS — K5731 Diverticulosis of large intestine without perforation or abscess with bleeding: Secondary | ICD-10-CM | POA: Diagnosis not present

## 2023-04-21 DIAGNOSIS — Z7984 Long term (current) use of oral hypoglycemic drugs: Secondary | ICD-10-CM | POA: Diagnosis not present

## 2023-04-21 DIAGNOSIS — K56609 Unspecified intestinal obstruction, unspecified as to partial versus complete obstruction: Secondary | ICD-10-CM

## 2023-04-21 DIAGNOSIS — Z8249 Family history of ischemic heart disease and other diseases of the circulatory system: Secondary | ICD-10-CM | POA: Diagnosis not present

## 2023-04-21 DIAGNOSIS — Z808 Family history of malignant neoplasm of other organs or systems: Secondary | ICD-10-CM | POA: Diagnosis not present

## 2023-04-21 DIAGNOSIS — Z87891 Personal history of nicotine dependence: Secondary | ICD-10-CM | POA: Diagnosis not present

## 2023-04-21 DIAGNOSIS — K56699 Other intestinal obstruction unspecified as to partial versus complete obstruction: Secondary | ICD-10-CM | POA: Diagnosis not present

## 2023-04-21 DIAGNOSIS — K4030 Unilateral inguinal hernia, with obstruction, without gangrene, not specified as recurrent: Secondary | ICD-10-CM | POA: Diagnosis not present

## 2023-04-21 DIAGNOSIS — I10 Essential (primary) hypertension: Secondary | ICD-10-CM | POA: Diagnosis present

## 2023-04-21 DIAGNOSIS — E119 Type 2 diabetes mellitus without complications: Secondary | ICD-10-CM | POA: Diagnosis not present

## 2023-04-21 DIAGNOSIS — Z833 Family history of diabetes mellitus: Secondary | ICD-10-CM

## 2023-04-21 DIAGNOSIS — Z9049 Acquired absence of other specified parts of digestive tract: Secondary | ICD-10-CM | POA: Diagnosis not present

## 2023-04-21 DIAGNOSIS — Z791 Long term (current) use of non-steroidal anti-inflammatories (NSAID): Secondary | ICD-10-CM | POA: Diagnosis not present

## 2023-04-21 DIAGNOSIS — Z7982 Long term (current) use of aspirin: Secondary | ICD-10-CM

## 2023-04-21 DIAGNOSIS — K6389 Other specified diseases of intestine: Secondary | ICD-10-CM | POA: Diagnosis not present

## 2023-04-21 DIAGNOSIS — I2510 Atherosclerotic heart disease of native coronary artery without angina pectoris: Secondary | ICD-10-CM | POA: Diagnosis not present

## 2023-04-21 HISTORY — PX: FLEXIBLE SIGMOIDOSCOPY: SHX5431

## 2023-04-21 LAB — GLUCOSE, CAPILLARY
Glucose-Capillary: 106 mg/dL — ABNORMAL HIGH (ref 70–99)
Glucose-Capillary: 172 mg/dL — ABNORMAL HIGH (ref 70–99)
Glucose-Capillary: 139 mg/dL — ABNORMAL HIGH (ref 70–99)
Glucose-Capillary: 135 mg/dL — ABNORMAL HIGH (ref 70–99)
Glucose-Capillary: 138 mg/dL — ABNORMAL HIGH (ref 70–99)

## 2023-04-21 LAB — ABO/RH: ABO/RH(D): O POS

## 2023-04-21 SURGERY — COLECTOMY, PARTIAL, ROBOT-ASSISTED, LAPAROSCOPIC
Anesthesia: General

## 2023-04-21 MED ORDER — KETAMINE HCL 10 MG/ML IJ SOLN
INTRAMUSCULAR | Status: DC | PRN
  Administered 2023-04-21 (×2): 25 mg via INTRAVENOUS

## 2023-04-21 MED ORDER — DIPHENHYDRAMINE HCL 12.5 MG/5ML PO ELIX: 12.5000 mg | ORAL_SOLUTION | Freq: Four times a day (QID) | ORAL | Status: DC | PRN

## 2023-04-21 MED ORDER — SODIUM CHLORIDE 0.9 % IV SOLN
2.0000 g | Freq: Two times a day (BID) | INTRAVENOUS | Status: AC
  Administered 2023-04-21: 2 g via INTRAVENOUS
  Filled 2023-04-21: qty 2

## 2023-04-21 MED ORDER — LIDOCAINE HCL (PF) 2 % IJ SOLN
INTRAMUSCULAR | Status: AC
  Filled 2023-04-21: qty 5

## 2023-04-21 MED ORDER — ROCURONIUM BROMIDE 100 MG/10ML IV SOLN
INTRAVENOUS | Status: DC | PRN
  Administered 2023-04-21: 30 mg via INTRAVENOUS
  Administered 2023-04-21: 50 mg via INTRAVENOUS

## 2023-04-21 MED ORDER — LACTATED RINGERS IR SOLN
Status: DC | PRN
Start: 2023-04-21 — End: 2023-04-21
  Administered 2023-04-21: 1000 mL

## 2023-04-21 MED ORDER — SUGAMMADEX SODIUM 200 MG/2ML IV SOLN
INTRAVENOUS | Status: DC | PRN
  Administered 2023-04-21: 200 mg via INTRAVENOUS

## 2023-04-21 MED ORDER — PANTOPRAZOLE SODIUM 40 MG PO TBEC
40.0000 mg | DELAYED_RELEASE_TABLET | Freq: Every day | ORAL | Status: DC
  Administered 2023-04-21 – 2023-04-23 (×3): 40 mg via ORAL
  Filled 2023-04-21 (×3): qty 1

## 2023-04-21 MED ORDER — ENSURE PRE-SURGERY PO LIQD
296.0000 mL | Freq: Once | ORAL | Status: DC
  Filled 2023-04-21: qty 296

## 2023-04-21 MED ORDER — ENSURE SURGERY PO LIQD
237.0000 mL | Freq: Two times a day (BID) | ORAL | Status: DC
  Administered 2023-04-22: 237 mL via ORAL

## 2023-04-21 MED ORDER — ALVIMOPAN 12 MG PO CAPS
12.0000 mg | ORAL_CAPSULE | Freq: Two times a day (BID) | ORAL | Status: DC
  Administered 2023-04-22: 12 mg via ORAL
  Filled 2023-04-21 (×2): qty 1

## 2023-04-21 MED ORDER — LIDOCAINE HCL (CARDIAC) PF 100 MG/5ML IV SOSY
PREFILLED_SYRINGE | INTRAVENOUS | Status: DC | PRN
  Administered 2023-04-21 (×2): 25 mg via INTRAVENOUS

## 2023-04-21 MED ORDER — BUPIVACAINE LIPOSOME 1.3 % IJ SUSP
INTRAMUSCULAR | Status: AC
  Filled 2023-04-21: qty 20

## 2023-04-21 MED ORDER — BUPIVACAINE LIPOSOME 1.3 % IJ SUSP: 20.0000 mL | Freq: Once | INTRAMUSCULAR | Status: DC

## 2023-04-21 MED ORDER — CHLORHEXIDINE GLUCONATE 0.12 % MT SOLN
15.0000 mL | Freq: Once | OROMUCOSAL | Status: AC
  Administered 2023-04-21: 15 mL via OROMUCOSAL

## 2023-04-21 MED ORDER — TRAMADOL HCL 50 MG PO TABS
50.0000 mg | ORAL_TABLET | Freq: Four times a day (QID) | ORAL | Status: DC | PRN
  Administered 2023-04-21 – 2023-04-22 (×2): 100 mg via ORAL
  Filled 2023-04-21 (×3): qty 2

## 2023-04-21 MED ORDER — FENTANYL CITRATE (PF) 100 MCG/2ML IJ SOLN
INTRAMUSCULAR | Status: DC | PRN
  Administered 2023-04-21 (×4): 50 ug via INTRAVENOUS

## 2023-04-21 MED ORDER — DROPERIDOL 2.5 MG/ML IJ SOLN: 0.6250 mg | Freq: Once | INTRAMUSCULAR | Status: DC | PRN

## 2023-04-21 MED ORDER — SODIUM CHLORIDE 0.9 % IV SOLN
2.0000 g | INTRAVENOUS | Status: AC
  Administered 2023-04-21: 2 g via INTRAVENOUS
  Filled 2023-04-21: qty 2

## 2023-04-21 MED ORDER — LACTATED RINGERS IV SOLN: INTRAVENOUS | Status: DC | PRN

## 2023-04-21 MED ORDER — PROPOFOL 10 MG/ML IV BOLUS
INTRAVENOUS | Status: DC | PRN
  Administered 2023-04-21: 120 mg via INTRAVENOUS

## 2023-04-21 MED ORDER — GABAPENTIN 300 MG PO CAPS
300.0000 mg | ORAL_CAPSULE | ORAL | Status: AC
  Administered 2023-04-21: 300 mg via ORAL
  Filled 2023-04-21: qty 1

## 2023-04-21 MED ORDER — ONDANSETRON HCL 4 MG PO TABS: 4.0000 mg | ORAL_TABLET | Freq: Four times a day (QID) | ORAL | Status: DC | PRN

## 2023-04-21 MED ORDER — LIDOCAINE 2% (20 MG/ML) 5 ML SYRINGE
INTRAMUSCULAR | Status: DC | PRN
  Administered 2023-04-21: 1 mg/kg/h via INTRAVENOUS

## 2023-04-21 MED ORDER — PROPOFOL 10 MG/ML IV BOLUS
INTRAVENOUS | Status: AC
  Filled 2023-04-21: qty 20

## 2023-04-21 MED ORDER — METFORMIN HCL 500 MG PO TABS
500.0000 mg | ORAL_TABLET | Freq: Two times a day (BID) | ORAL | Status: DC
  Administered 2023-04-21 – 2023-04-23 (×4): 500 mg via ORAL
  Filled 2023-04-21 (×4): qty 1

## 2023-04-21 MED ORDER — ACETAMINOPHEN 500 MG PO TABS
1000.0000 mg | ORAL_TABLET | Freq: Four times a day (QID) | ORAL | Status: DC
  Administered 2023-04-21 – 2023-04-23 (×7): 1000 mg via ORAL
  Filled 2023-04-21 (×7): qty 2

## 2023-04-21 MED ORDER — DIPHENHYDRAMINE HCL 50 MG/ML IJ SOLN: 12.5000 mg | Freq: Four times a day (QID) | INTRAMUSCULAR | Status: DC | PRN

## 2023-04-21 MED ORDER — 0.9 % SODIUM CHLORIDE (POUR BTL) OPTIME
TOPICAL | Status: DC | PRN
Start: 2023-04-21 — End: 2023-04-21
  Administered 2023-04-21: 1000 mL

## 2023-04-21 MED ORDER — FENTANYL CITRATE PF 50 MCG/ML IJ SOSY
PREFILLED_SYRINGE | INTRAMUSCULAR | Status: AC
  Administered 2023-04-21: 25 ug via INTRAVENOUS
  Filled 2023-04-21: qty 2

## 2023-04-21 MED ORDER — ONDANSETRON HCL 4 MG/2ML IJ SOLN
INTRAMUSCULAR | Status: DC | PRN
  Administered 2023-04-21: 4 mg via INTRAVENOUS

## 2023-04-21 MED ORDER — ALUM & MAG HYDROXIDE-SIMETH 200-200-20 MG/5ML PO SUSP: 30.0000 mL | Freq: Four times a day (QID) | ORAL | Status: DC | PRN

## 2023-04-21 MED ORDER — SIMETHICONE 80 MG PO CHEW: 40.0000 mg | CHEWABLE_TABLET | Freq: Four times a day (QID) | ORAL | Status: DC | PRN

## 2023-04-21 MED ORDER — LACTATED RINGERS IV SOLN: INTRAVENOUS | Status: DC

## 2023-04-21 MED ORDER — PHENYLEPHRINE HCL-NACL 20-0.9 MG/250ML-% IV SOLN
INTRAVENOUS | Status: DC | PRN
  Administered 2023-04-21: 30 ug/min via INTRAVENOUS

## 2023-04-21 MED ORDER — ROCURONIUM BROMIDE 10 MG/ML (PF) SYRINGE
PREFILLED_SYRINGE | INTRAVENOUS | Status: AC
  Filled 2023-04-21: qty 10

## 2023-04-21 MED ORDER — BUPIVACAINE-EPINEPHRINE 0.25% -1:200000 IJ SOLN
INTRAMUSCULAR | Status: AC
  Filled 2023-04-21: qty 1

## 2023-04-21 MED ORDER — LISINOPRIL-HYDROCHLOROTHIAZIDE 10-12.5 MG PO TABS: 1.0000 | ORAL_TABLET | Freq: Every day | ORAL | Status: DC

## 2023-04-21 MED ORDER — LIDOCAINE HCL 2 % IJ SOLN
INTRAMUSCULAR | Status: AC
  Filled 2023-04-21: qty 20

## 2023-04-21 MED ORDER — INSULIN ASPART 100 UNIT/ML IJ SOLN
0.0000 [IU] | Freq: Three times a day (TID) | INTRAMUSCULAR | Status: DC
  Administered 2023-04-21 – 2023-04-22 (×4): 3 [IU] via SUBCUTANEOUS

## 2023-04-21 MED ORDER — BUPIVACAINE-EPINEPHRINE (PF) 0.25% -1:200000 IJ SOLN
INTRAMUSCULAR | Status: DC | PRN
  Administered 2023-04-21: 50 mL

## 2023-04-21 MED ORDER — ENOXAPARIN SODIUM 40 MG/0.4ML IJ SOSY
40.0000 mg | PREFILLED_SYRINGE | INTRAMUSCULAR | Status: DC
  Administered 2023-04-22: 40 mg via SUBCUTANEOUS
  Filled 2023-04-21 (×2): qty 0.4

## 2023-04-21 MED ORDER — GABAPENTIN 100 MG PO CAPS
300.0000 mg | ORAL_CAPSULE | Freq: Two times a day (BID) | ORAL | Status: DC
  Administered 2023-04-21 – 2023-04-23 (×4): 300 mg via ORAL
  Filled 2023-04-21 (×4): qty 3

## 2023-04-21 MED ORDER — ENSURE PRE-SURGERY PO LIQD
592.0000 mL | Freq: Once | ORAL | Status: DC
  Filled 2023-04-21: qty 592

## 2023-04-21 MED ORDER — MECLIZINE HCL 25 MG PO TABS: 25.0000 mg | ORAL_TABLET | Freq: Three times a day (TID) | ORAL | Status: DC | PRN

## 2023-04-21 MED ORDER — LABETALOL HCL 5 MG/ML IV SOLN
INTRAVENOUS | Status: DC | PRN
Start: 2023-04-21 — End: 2023-04-21
  Administered 2023-04-21 (×2): 5 mg via INTRAVENOUS

## 2023-04-21 MED ORDER — LISINOPRIL 10 MG PO TABS
10.0000 mg | ORAL_TABLET | Freq: Every day | ORAL | Status: DC
  Administered 2023-04-22 – 2023-04-23 (×2): 10 mg via ORAL
  Filled 2023-04-21 (×2): qty 1

## 2023-04-21 MED ORDER — LABETALOL HCL 5 MG/ML IV SOLN
INTRAVENOUS | Status: AC
  Filled 2023-04-21: qty 4

## 2023-04-21 MED ORDER — KCL IN DEXTROSE-NACL 20-5-0.45 MEQ/L-%-% IV SOLN
INTRAVENOUS | Status: DC
  Filled 2023-04-21: qty 1000

## 2023-04-21 MED ORDER — ONDANSETRON HCL 4 MG/2ML IJ SOLN: 4.0000 mg | Freq: Four times a day (QID) | INTRAMUSCULAR | Status: DC | PRN

## 2023-04-21 MED ORDER — HYDROMORPHONE HCL 1 MG/ML IJ SOLN: 0.5000 mg | INTRAMUSCULAR | Status: DC | PRN

## 2023-04-21 MED ORDER — FENTANYL CITRATE (PF) 100 MCG/2ML IJ SOLN
INTRAMUSCULAR | Status: AC
  Filled 2023-04-21: qty 2

## 2023-04-21 MED ORDER — MELOXICAM 15 MG PO TABS
15.0000 mg | ORAL_TABLET | Freq: Every day | ORAL | Status: DC
  Administered 2023-04-22 – 2023-04-23 (×2): 15 mg via ORAL
  Filled 2023-04-21 (×2): qty 1

## 2023-04-21 MED ORDER — ACETAMINOPHEN 500 MG PO TABS
1000.0000 mg | ORAL_TABLET | ORAL | Status: AC
  Administered 2023-04-21: 1000 mg via ORAL
  Filled 2023-04-21: qty 2

## 2023-04-21 MED ORDER — ORAL CARE MOUTH RINSE: 15.0000 mL | Freq: Once | OROMUCOSAL | Status: AC

## 2023-04-21 MED ORDER — OXYCODONE HCL 5 MG PO TABS: 5.0000 mg | ORAL_TABLET | Freq: Once | ORAL | Status: DC | PRN

## 2023-04-21 MED ORDER — KETAMINE HCL 50 MG/5ML IJ SOSY
PREFILLED_SYRINGE | INTRAMUSCULAR | Status: AC
  Filled 2023-04-21: qty 5

## 2023-04-21 MED ORDER — HEPARIN SODIUM (PORCINE) 5000 UNIT/ML IJ SOLN
5000.0000 [IU] | Freq: Once | INTRAMUSCULAR | Status: AC
  Administered 2023-04-21: 5000 [IU] via SUBCUTANEOUS
  Filled 2023-04-21: qty 1

## 2023-04-21 MED ORDER — ATORVASTATIN CALCIUM 10 MG PO TABS
10.0000 mg | ORAL_TABLET | ORAL | Status: DC
  Administered 2023-04-22: 10 mg via ORAL
  Filled 2023-04-21: qty 1

## 2023-04-21 MED ORDER — OXYCODONE HCL 5 MG/5ML PO SOLN: 5.0000 mg | Freq: Once | ORAL | Status: DC | PRN

## 2023-04-21 MED ORDER — SACCHAROMYCES BOULARDII 250 MG PO CAPS
250.0000 mg | ORAL_CAPSULE | Freq: Two times a day (BID) | ORAL | Status: DC
  Administered 2023-04-21 – 2023-04-23 (×4): 250 mg via ORAL
  Filled 2023-04-21 (×4): qty 1

## 2023-04-21 MED ORDER — ALVIMOPAN 12 MG PO CAPS
12.0000 mg | ORAL_CAPSULE | ORAL | Status: AC
  Administered 2023-04-21: 12 mg via ORAL
  Filled 2023-04-21: qty 1

## 2023-04-21 MED ORDER — HYDROCHLOROTHIAZIDE 12.5 MG PO TABS
12.5000 mg | ORAL_TABLET | Freq: Every day | ORAL | Status: DC
  Administered 2023-04-22 – 2023-04-23 (×2): 12.5 mg via ORAL
  Filled 2023-04-21 (×2): qty 1

## 2023-04-21 MED ORDER — FENTANYL CITRATE PF 50 MCG/ML IJ SOSY: 25.0000 ug | PREFILLED_SYRINGE | INTRAMUSCULAR | Status: DC | PRN

## 2023-04-21 SURGICAL SUPPLY — 83 items
BAG COUNTER SPONGE SURGICOUNT (BAG) ×2 IMPLANT
BAG SPNG CNTER NS LX DISP (BAG) ×2
BLADE EXTENDED COATED 6.5IN (ELECTRODE) IMPLANT
CANNULA REDUCER 12-8 DVNC XI (CANNULA) IMPLANT
COVER SURGICAL LIGHT HANDLE (MISCELLANEOUS) ×4 IMPLANT
COVER TIP SHEARS 8 DVNC (MISCELLANEOUS) ×2 IMPLANT
DRAIN CHANNEL 19F RND (DRAIN) IMPLANT
DRAPE ARM DVNC X/XI (DISPOSABLE) ×8 IMPLANT
DRAPE COLUMN DVNC XI (DISPOSABLE) ×2 IMPLANT
DRAPE SURG IRRIG POUCH 19X23 (DRAPES) ×2 IMPLANT
DRIVER NDL LRG 8 DVNC XI (INSTRUMENTS) ×2 IMPLANT
DRIVER NDLE LRG 8 DVNC XI (INSTRUMENTS)
DRSG OPSITE POSTOP 4X10 (GAUZE/BANDAGES/DRESSINGS) IMPLANT
DRSG OPSITE POSTOP 4X6 (GAUZE/BANDAGES/DRESSINGS) IMPLANT
DRSG OPSITE POSTOP 4X8 (GAUZE/BANDAGES/DRESSINGS) IMPLANT
ELECT PENCIL ROCKER SW 15FT (MISCELLANEOUS) ×2 IMPLANT
ELECT REM PT RETURN 15FT ADLT (MISCELLANEOUS) ×2 IMPLANT
ENDOLOOP SUT PDS II 0 18 (SUTURE) IMPLANT
EVACUATOR SILICONE 100CC (DRAIN) IMPLANT
FORCEPS BPLR FENES DVNC XI (FORCEP) IMPLANT
GLOVE BIO SURGEON STRL SZ 6.5 (GLOVE) ×6 IMPLANT
GLOVE INDICATOR 6.5 STRL GRN (GLOVE) ×6 IMPLANT
GOWN SRG XL LVL 4 BRTHBL STRL (GOWNS) ×2 IMPLANT
GOWN STRL NON-REIN XL LVL4 (GOWNS) ×2
GOWN STRL REUS W/ TWL XL LVL3 (GOWN DISPOSABLE) ×6 IMPLANT
GOWN STRL REUS W/TWL XL LVL3 (GOWN DISPOSABLE) ×6
GRASPER SUT TROCAR 14GX15 (MISCELLANEOUS) IMPLANT
GRASPER TIP-UP FEN DVNC XI (INSTRUMENTS) ×2 IMPLANT
HOLDER FOLEY CATH W/STRAP (MISCELLANEOUS) ×2 IMPLANT
IRRIG SUCT STRYKERFLOW 2 WTIP (MISCELLANEOUS) ×2
IRRIGATION SUCT STRKRFLW 2 WTP (MISCELLANEOUS) ×2 IMPLANT
KIT PROCEDURE DVNC SI (MISCELLANEOUS) IMPLANT
KIT TURNOVER KIT A (KITS) IMPLANT
NDL INSUFFLATION 14GA 120MM (NEEDLE) ×2 IMPLANT
NEEDLE INSUFFLATION 14GA 120MM (NEEDLE) ×2
PACK CARDIOVASCULAR III (CUSTOM PROCEDURE TRAY) ×2 IMPLANT
PACK COLON (CUSTOM PROCEDURE TRAY) ×2 IMPLANT
PAD POSITIONING PINK XL (MISCELLANEOUS) ×2 IMPLANT
RELOAD STAPLE 60 3.5 BLU DVNC (STAPLE) IMPLANT
RELOAD STAPLE 60 4.3 GRN DVNC (STAPLE) IMPLANT
RETRACTOR WND ALEXIS 18 MED (MISCELLANEOUS) IMPLANT
RTRCTR WOUND ALEXIS 18CM MED (MISCELLANEOUS)
SCISSORS LAP 5X35 DISP (ENDOMECHANICALS) IMPLANT
SCISSORS MNPLR CVD DVNC XI (INSTRUMENTS) ×2 IMPLANT
SEAL UNIV 5-12 XI (MISCELLANEOUS) ×6 IMPLANT
SEALER VESSEL EXT DVNC XI (MISCELLANEOUS) ×2 IMPLANT
SOL ELECTROSURG ANTI STICK (MISCELLANEOUS) ×2
SOLUTION ELECTROSURG ANTI STCK (MISCELLANEOUS) ×2 IMPLANT
SPIKE FLUID TRANSFER (MISCELLANEOUS) IMPLANT
STAPLER 60 SUREFORM DVNC (STAPLE) IMPLANT
STAPLER ECHELON POWER CIR 29 (STAPLE) IMPLANT
STAPLER ECHELON POWER CIR 31 (STAPLE) IMPLANT
STAPLER RELOAD 3.5X60 BLU DVNC (STAPLE)
STAPLER RELOAD 4.3X60 GRN DVNC (STAPLE) ×2
STOPCOCK 4 WAY LG BORE MALE ST (IV SETS) ×4 IMPLANT
SUT ETHILON 2 0 PS N (SUTURE) IMPLANT
SUT NOVA NAB GS-21 1 T12 (SUTURE) ×4 IMPLANT
SUT PROLENE 2 0 KS (SUTURE) IMPLANT
SUT SILK 2 0 (SUTURE) ×2
SUT SILK 2 0 SH CR/8 (SUTURE) IMPLANT
SUT SILK 2-0 18XBRD TIE 12 (SUTURE) ×2 IMPLANT
SUT SILK 3 0 (SUTURE)
SUT SILK 3 0 SH CR/8 (SUTURE) ×2 IMPLANT
SUT SILK 3-0 18XBRD TIE 12 (SUTURE) IMPLANT
SUT V-LOC BARB 180 2/0GR6 GS22 (SUTURE)
SUT VIC AB 2-0 SH 18 (SUTURE) IMPLANT
SUT VIC AB 2-0 SH 27 (SUTURE)
SUT VIC AB 2-0 SH 27X BRD (SUTURE) IMPLANT
SUT VIC AB 3-0 SH 18 (SUTURE) IMPLANT
SUT VIC AB 4-0 PS2 27 (SUTURE) ×4 IMPLANT
SUT VICRYL 0 UR6 27IN ABS (SUTURE) ×2 IMPLANT
SUTURE V-LC BRB 180 2/0GR6GS22 (SUTURE) IMPLANT
SYR 20ML ECCENTRIC (SYRINGE) ×2 IMPLANT
SYS LAPSCP GELPORT 120MM (MISCELLANEOUS)
SYS WOUND ALEXIS 18CM MED (MISCELLANEOUS)
SYSTEM LAPSCP GELPORT 120MM (MISCELLANEOUS) IMPLANT
SYSTEM WOUND ALEXIS 18CM MED (MISCELLANEOUS) IMPLANT
TOWEL OR 17X26 10 PK STRL BLUE (TOWEL DISPOSABLE) IMPLANT
TOWEL OR NON WOVEN STRL DISP B (DISPOSABLE) ×2 IMPLANT
TRAY FOLEY MTR SLVR 16FR STAT (SET/KITS/TRAYS/PACK) ×2 IMPLANT
TROCAR ADV FIXATION 5X100MM (TROCAR) ×2 IMPLANT
TUBING CONNECTING 10 (TUBING) ×4 IMPLANT
TUBING INSUFFLATION 10FT LAP (TUBING) ×2 IMPLANT

## 2023-04-21 NOTE — Op Note (Signed)
04/21/2023  2:29 PM  PATIENT:  Michelle Spears  77 y.o. female  Patient Care Team: [name], MD as PCP - General (Endocrinology) Marykay Lex, MD as PCP - Cardiology (Cardiology)  PRE-OPERATIVE DIAGNOSIS:  COLONIC STRICTURE  POST-OPERATIVE DIAGNOSIS:  COLONIC STRICTURE  PROCEDURE:  XI ROBOT ASSISTED SIGMOIDECTOMY FLEXIBLE SIGMOIDOSCOPY    Surgeon(s): [name], MD Karie Soda, MD  ASSISTANT: Dr Michaell Cowing   ANESTHESIA:   local and general  EBL: 50 ml Total I/O In: 1300 [I.V.:1200; IV Piggyback:100] Out: -   Delay start of Pharmacological VTE agent (>24hrs) due to surgical blood loss or risk of bleeding:  no  DRAINS: none   SPECIMEN:  Source of Specimen:  Sigmoid colon  DISPOSITION OF SPECIMEN:  PATHOLOGY  COUNTS:  YES  PLAN OF CARE: Admit to inpatient   PATIENT DISPOSITION:  PACU - hemodynamically stable.  INDICATION:    77 y.o. F with benign appearing sigmoid stricture seen on colonoscopy and CT.  I recommended segmental resection:  The anatomy & physiology of the digestive tract was discussed.  The pathophysiology was discussed.  Natural history risks without surgery was discussed.   I worked to give an overview of the disease and the frequent need to have multispecialty involvement.  I feel the risks of no intervention will lead to serious problems that outweigh the operative risks; therefore, I recommended a partial colectomy to remove the pathology.  Minimally invasive & open techniques were discussed.   Risks such as bleeding, infection, abscess, leak, reoperation, possible ostomy, hernia, heart attack, death, and other risks were discussed.  I noted a good likelihood this will help address the problem.   Goals of post-operative recovery were discussed as well.    The patient expressed understanding & wished to proceed with surgery.  OR FINDINGS:   Patient had chronically inflamed and scarred distal sigmoid colon, non-incarcerated L Inguinal  Hernia  The anastomosis rests ~11 cm from the anal verge by rigid proctoscopy.  DESCRIPTION:   Informed consent was confirmed.  The patient underwent general anaesthesia without difficulty.  The patient was positioned appropriately.  VTE prevention in place.  The patient's abdomen was clipped, prepped, & draped in a sterile fashion.  Surgical timeout confirmed our plan.  The patient was positioned in reverse Trendelenburg.  Abdominal entry was gained using a Varies needle.  Entry was clean.  Access was gained on the 3rd attempt.  I induced carbon dioxide insufflation.  An 8mm robotic port was placed in the RUQ.  Camera inspection revealed no injury.  Extra ports were carefully placed under direct laparoscopic visualization.  I laparoscopically reflected the greater omentum and the upper abdomen the small bowel in the upper abdomen. The patient was appropriately positioned and the robot was docked to the patient's left side.  Instruments were placed under direct visualization.    I mobilized the sigmoid colon off of the pelvic sidewall. I scored the base of peritoneum of the right side of the mesentery of the left colon from the ligament of Treitz to the peritoneal reflection of the mid rectum.  The patient had significant scarring and diverticular disease of the distal sigmoid colon.  I elevated the sigmoid mesentery and enetered into the retro-mesenteric plane. We were able to identify the left ureter and gonadal vessels. We kept those posterior within the retroperitoneum and elevated the left colon mesentery off that. I did isolated IMA pedicle but did not ligate it yet.  I continued distally and got into  the avascular plane posterior to the mesorectum. This allowed me to help mobilize the rectum as well by freeing the mesorectum off the sacrum.  I mobilized the peritoneal coverings towards the peritoneal reflection on both the right and left sides of the rectum.  I could see the right and left ureters  and stayed away from them.    I skeletonized the inferior mesenteric artery pedicle.  After confirming the left ureter was out of the way, I went ahead and ligated the inferior mesenteric artery pedicle with bipolar robotic vessel sealer well above its takeoff from the aorta.  We ensured hemostasis. I skeletonized the mesorectum at the junction at the proximal rectum using blunt dissection & bipolar robotic vessel sealer.  I mobilized the left colon in a lateral to medial fashion off the line of Toldt up towards the splenic flexure to ensure good mobilization of the left colon to reach into the pelvis.  I divided the mesentery to the level of the descending sigmoid junction using the robotic vessel sealer.  Firefly was then injected intravenously to confirm good perfusion to the remaining colon and rectum.  This was confirmed and a 60 mm green load stapler was introduced through the 12 mm suprapubic port.  The proximal rectum was divided using the stapler.  At this point the robotic instruments were withdrawn and the robot was undocked.  The 12 mm port was enlarged to a Pfannenstiel incision and a Alexis wound protector was placed.  The colon was brought out through the wound and divided at the previously dissected margin over a pursestring device.  A 2-0 Prolene pursestring was placed.  This was secured with 3-0 silk sutures.  A 29 mm EEA anvil was then placed into the colon and pursestring tied tightly around this.  This was then placed back into the abdomen and the abdomen was reinsufflated.  The EEA stapler was introduced into the rectum and brought out through the distal edge.  An anastomosis was created laparoscopically.  There was no tension on the anastomosis.  There was no leak when tested with insufflation under irrigation.  Anastomosis rests approximately 11 cm from the sphincter complex.  At this point we irrigated the abdomen.  The entire abdomen was evaluated and there was no sign of active  bleeding.  Robotic instruments were removed.  We switched to clean gowns, gloves, instruments and drapes.  The Pfannenstiel incision was closed using a running 0 Vicryl to close the peritoneum.  A running #1 Novafil suture x 2 was used to close the fascial layer.  A running 2-0 Vicryl suture was used to close the subcutaneous layer and the skin was closed using a running 4-0 Vicryl subcuticular suture.  A sterile dressing was applied.  The remaining port sites were closed using interrupted 4-0 Vicryl suture and Dermabond.  The patient was then awakened from anesthesia and sent to the postanesthesia care unit in stable condition.  All counts were correct per operating room staff.  Vanita Panda, MD  Colorectal and General Surgery [facility]   An MD assistant was necessary for tissue manipulation, retraction and positioning due to the complexity of the case and hospital policies

## 2023-04-21 NOTE — H&P (Signed)
REFERRING PHYSICIAN:  Iva Boop, MD   PROVIDER:  Elenora Gamma, MD   MRN: [id number] DOB: [birth year]    Subjective    Chief Complaint: New Consultation       History of Present Illness: Michelle Spears is a 77 y.o. female who is seen today as an office consultation at the request of Dr. Leone Payor for evaluation of New Consultation .  77 year old female who presents to the office due to chronic recurrent diarrhea and left lower quadrant pain.  This is thought to have been diverticulitis but CT scans have only shown this once in 2016.  She underwent a screening colonoscopy in February of this year which demonstrated severe diverticulosis in the sigmoid colon with narrowing.  She has tried changing her diet and diarrhea medications but continues to have episodes of pain.     Review of Systems: A complete review of systems was obtained from the patient.  I have reviewed this information and discussed as appropriate with the patient.  See HPI as well for other ROS.     Medical History:     Past Medical History:  Diagnosis Date   Arthritis     Diabetes mellitus without complication (CMS/HHS-HCC)     GERD (gastroesophageal reflux disease)     Hypertension        There is no problem list on file for this patient.          Past Surgical History:  Procedure Laterality Date   Hand surgery   2018   CHOLECYSTECTOMY       HYSTERECTOMY          No Known Allergies         Current Outpatient Medications on File Prior to Visit  Medication Sig Dispense Refill   ascorbic acid, vitamin C, (VITAMIN C) 1000 MG tablet Take 1,000 mg by mouth once daily       aspirin 81 MG EC tablet Take 81 mg by mouth once daily       cetirizine (ZYRTEC) 10 MG tablet Take 10 mg by mouth once daily       gabapentin (NEURONTIN) 300 MG capsule Take by mouth       lisinopriL-hydroCHLOROthiazide (ZESTORETIC) 10-12.5 mg tablet         loperamide (IMODIUM A-D) 2 mg tablet Take by mouth       meloxicam  (MOBIC) 15 MG tablet Take 15 mg by mouth once daily       metFORMIN (GLUCOPHAGE-XR) 500 MG XR tablet Take 500 mg by mouth 2 (two) times daily       omeprazole (PRILOSEC) 20 MG DR capsule Take 20 mg by mouth once daily       potassium iodide 65 mg Tab Take by mouth       DOCOSAHEXAENOIC ACID ORAL Take 1 g by mouth once daily (Patient not taking: Reported on 02/15/2023)        No current facility-administered medications on file prior to visit.           Family History  Problem Relation Age of Onset   Skin cancer Mother     Hyperlipidemia (Elevated cholesterol) Mother     Obesity Father     Coronary Artery Disease (Blocked arteries around heart) Father     Hyperlipidemia (Elevated cholesterol) Sister     Diabetes Brother        Social History        Tobacco Use  Smoking Status Former   Types:  Cigarettes   Start date: 2006  Smokeless Tobacco Never      Social History         Socioeconomic History   Marital status: Married  Tobacco Use   Smoking status: Former      Types: Cigarettes      Start date: 2006   Smokeless tobacco: Never  Substance and Sexual Activity   Alcohol use: Never   Drug use: Never      Objective:      Vitals:   04/21/23 1125  BP: (!) 169/84  Pulse: 84  Resp: 16  Temp: 98.2 F (36.8 C)  SpO2: 96%    Exam Gen: NAD CV: RRR Lungs: CTA Abd: soft       Labs, Imaging and Diagnostic Testing:  CT reviewed   Assessment and Plan:  Diagnoses and all orders for this visit:   Diverticular stricture (CMS/HHS-HCC)   77 year old female with worsening diarrhea over the past year.  Colonoscopy relatively underwhelming.  She does have a diverticular stricture and significant diverticulosis on colonoscopy as well as CT scan.  She has had at least 1 and possibly 2 episodes of CT proven diverticulitis in the past.  She has tried managing her diarrhea with medications and dietary changes.  She has had no significant relief in her symptoms.  We discussed  today that possibly removing her diverticular stricture may make her symptoms improved.  We discussed that there is a possibility that they could also make them worse.  I believe she is inclined to try the surgery and see if this will help with her symptoms.  She is working on managing some hip pain as well and will follow-up with a doctor later this week for this.  Once she has this information, she will be able to determine when to schedule surgery. The surgery and anatomy were described to the patient as well as the risks of surgery and the possible complications.  These include: Bleeding, deep abdominal infections and possible wound complications such as hernia and infection, damage to adjacent structures, leak of surgical connections, which can lead to other surgeries and possibly an ostomy, possible need for other procedures, such as abscess drains in radiology, possible prolonged hospital stay, possible diarrhea from removal of part of the colon, possible constipation from narcotics, possible bowel, bladder or sexual dysfunction if having rectal surgery, prolonged fatigue/weakness or appetite loss, possible early recurrence of of disease, possible complications of their medical problems such as heart disease or arrhythmias or lung problems, death (less than 1%). I believe the patient understands and wishes to proceed with the surgery.   Michelle Panda, MD Colon and Rectal Surgery [facility] Surgery

## 2023-04-21 NOTE — Transfer of Care (Signed)
Immediate Anesthesia Transfer of Care Note  Patient: Michelle Spears  Procedure(s) Performed: XI ROBOT ASSISTED LAPAROSCOPIC SIGMOIDECTOMY FLEXIBLE SIGMOIDOSCOPY  Patient Location: PACU  Anesthesia Type:General  Level of Consciousness: awake and patient cooperative  Airway & Oxygen Therapy: Patient Spontanous Breathing and Patient connected to face mask oxygen  Post-op Assessment: Report given to RN and Post -op Vital signs reviewed and stable  Post vital signs: Reviewed and stable  Last Vitals:  Vitals Value Taken Time  BP 173/76 04/21/23 1446  Temp    Pulse 75 04/21/23 1448  Resp 16 04/21/23 1448  SpO2 100 % 04/21/23 1448  Vitals shown include unfiled device data.  Last Pain:  Vitals:   04/21/23 1125  TempSrc: Oral  PainSc:          Complications: No notable events documented.

## 2023-04-21 NOTE — Anesthesia Procedure Notes (Signed)
Procedure Name: Intubation Date/Time: 04/21/2023 12:44 PM  Performed by: Sandie Ano, CRNAPre-anesthesia Checklist: Patient identified, Emergency Drugs available, Suction available and Patient being monitored Patient Re-evaluated:Patient Re-evaluated prior to induction Oxygen Delivery Method: Circle System Utilized Preoxygenation: Pre-oxygenation with 100% oxygen Induction Type: IV induction Ventilation: Mask ventilation without difficulty Laryngoscope Size: Mac and 3 Grade View: Grade I Tube type: Oral Tube size: 7.0 mm Number of attempts: 1 Airway Equipment and Method: Stylet and Oral airway Placement Confirmation: ETT inserted through vocal cords under direct vision, positive ETCO2 and breath sounds checked- equal and bilateral Secured at: 22 cm Tube secured with: Tape Dental Injury: Teeth and Oropharynx as per pre-operative assessment

## 2023-04-21 NOTE — Anesthesia Preprocedure Evaluation (Signed)
Anesthesia Evaluation  Patient identified by MRN, date of birth, ID band Patient awake    Reviewed: Allergy & Precautions, NPO status , Patient's Chart, lab work & pertinent test results  History of Anesthesia Complications (+) PONV and history of anesthetic complications  Airway Mallampati: II  TM Distance: >3 FB Neck ROM: Full    Dental no notable dental hx.    Pulmonary neg pulmonary ROS, former smoker   Pulmonary exam normal        Cardiovascular hypertension, + CAD   Rhythm:Regular Rate:Normal     Neuro/Psych  Headaches  negative psych ROS   GI/Hepatic Neg liver ROS,GERD  ,,Colonic stricture    Endo/Other  diabetes, Type 2, Oral Hypoglycemic Agents    Renal/GU   negative genitourinary   Musculoskeletal  (+) Arthritis , Osteoarthritis,    Abdominal Normal abdominal exam  (+)   Peds  Hematology Lab Results      Component                Value               Date                      WBC                      5.2                 04/08/2023                HGB                      13.0                04/08/2023                HCT                      38.5                04/08/2023                MCV                      96.3                04/08/2023                PLT                      167                 04/08/2023             Lab Results      Component                Value               Date                      NA                       138                 04/08/2023                K  3.9                 04/08/2023                CO2                      27                  04/08/2023                GLUCOSE                  176 (H)             04/08/2023                BUN                      15                  04/08/2023                CREATININE               0.66                04/08/2023                CALCIUM                  9.0                 04/08/2023                GFR                       77.85               09/24/2022                GFRNONAA                 >60                 04/08/2023              Anesthesia Other Findings   Reproductive/Obstetrics                             Anesthesia Physical Anesthesia Plan  ASA: 2  Anesthesia Plan: General   Post-op Pain Management: Tylenol PO (pre-op)* and Gabapentin PO (pre-op)*   Induction: Intravenous  PONV Risk Score and Plan: 4 or greater and Ondansetron, Dexamethasone, Treatment may vary due to age or medical condition and Droperidol  Airway Management Planned: Mask and Oral ETT  Additional Equipment: None  Intra-op Plan:   Post-operative Plan: Extubation in OR  Informed Consent: I have reviewed the patients History and Physical, chart, labs and discussed the procedure including the risks, benefits and alternatives for the proposed anesthesia with the patient or authorized representative who has indicated his/her understanding and acceptance.     Dental advisory given  Plan Discussed with: CRNA  Anesthesia Plan Comments:        Anesthesia Quick Evaluation

## 2023-04-22 ENCOUNTER — Encounter (HOSPITAL_COMMUNITY): Payer: Self-pay | Admitting: General Surgery

## 2023-04-22 LAB — CBC
HCT: 33.8 % — ABNORMAL LOW (ref 36.0–46.0)
Hemoglobin: 11.6 g/dL — ABNORMAL LOW (ref 12.0–15.0)
MCH: 32.9 pg (ref 26.0–34.0)
MCHC: 34.3 g/dL (ref 30.0–36.0)
MCV: 95.8 fL (ref 80.0–100.0)
Platelets: 175 10*3/uL (ref 150–400)
RBC: 3.53 MIL/uL — ABNORMAL LOW (ref 3.87–5.11)
RDW: 13.2 % (ref 11.5–15.5)
WBC: 10 10*3/uL (ref 4.0–10.5)
nRBC: 0 % (ref 0.0–0.2)

## 2023-04-22 LAB — BASIC METABOLIC PANEL
Anion gap: 12 (ref 5–15)
BUN: 6 mg/dL — ABNORMAL LOW (ref 8–23)
CO2: 26 mmol/L (ref 22–32)
Calcium: 8.1 mg/dL — ABNORMAL LOW (ref 8.9–10.3)
Chloride: 97 mmol/L — ABNORMAL LOW (ref 98–111)
Creatinine, Ser: 0.6 mg/dL (ref 0.44–1.00)
GFR, Estimated: >60 mL/min (ref >60)
Glucose, Bld: 112 mg/dL — ABNORMAL HIGH (ref 70–99)
Potassium: 3 mmol/L — ABNORMAL LOW (ref 3.5–5.1)
Sodium: 135 mmol/L (ref 135–145)

## 2023-04-22 LAB — GLUCOSE, CAPILLARY
Glucose-Capillary: 127 mg/dL — ABNORMAL HIGH (ref 70–99)
Glucose-Capillary: 136 mg/dL — ABNORMAL HIGH (ref 70–99)
Glucose-Capillary: 126 mg/dL — ABNORMAL HIGH (ref 70–99)
Glucose-Capillary: 117 mg/dL — ABNORMAL HIGH (ref 70–99)

## 2023-04-22 NOTE — Plan of Care (Signed)
  Problem: Education: Goal: Understanding of discharge needs will improve Outcome: Progressing Goal: Verbalization of understanding of the causes of altered bowel function will improve Outcome: Progressing

## 2023-04-22 NOTE — Progress Notes (Signed)
Mobility Specialist - Progress Note   04/22/23 1049  Mobility  Activity Ambulated independently in hallway  Level of Assistance Independent  Assistive Device None  Distance Ambulated (ft) 500 ft  Activity Response Tolerated well  Mobility Referral Yes  $Mobility charge 1 Mobility  Mobility Specialist Start Time (ACUTE ONLY) 1039  Mobility Specialist Stop Time (ACUTE ONLY) 1048  Mobility Specialist Time Calculation (min) (ACUTE ONLY) 9 min   Pt received in recliner and agreeable to mobility. No complaints during session. Pt to recliner after session with all needs met.    [facility]

## 2023-04-22 NOTE — Anesthesia Postprocedure Evaluation (Signed)
Anesthesia Post Note  Patient: Michelle Spears  Procedure(s) Performed: XI ROBOT ASSISTED LAPAROSCOPIC SIGMOIDECTOMY FLEXIBLE SIGMOIDOSCOPY     Patient location during evaluation: PACU Anesthesia Type: General Level of consciousness: awake and alert Pain management: pain level controlled Vital Signs Assessment: post-procedure vital signs reviewed and stable Respiratory status: spontaneous breathing, nonlabored ventilation, respiratory function stable and patient connected to nasal cannula oxygen Cardiovascular status: blood pressure returned to baseline and stable Postop Assessment: no apparent nausea or vomiting Anesthetic complications: no   No notable events documented.  Last Vitals:  Vitals:   04/22/23 0614 04/22/23 0901  BP: 125/66 137/60  Pulse: 89 75  Resp: 18 17  Temp: 36.9 C 36.9 C  SpO2: 92% 95%    Last Pain:  Vitals:   04/22/23 0939  TempSrc:   PainSc: 5                  Jefferson Fullam P Chane Cowden

## 2023-04-22 NOTE — Progress Notes (Signed)
1 Day Post-Op Robotic Sigmoidectomy Subjective: Doing well, feels sore.  Passing flatus and tolerating a liquid diet  Objective: Vital signs in last 24 hours: Temp:  [97.7 F (36.5 C)-98.6 F (37 C)] 98.4 F (36.9 C) (10/03 2952) Pulse Rate:  [59-89] 89 (10/03 0614) Resp:  [10-18] 18 (10/03 0614) BP: (124-174)/(59-84) 125/66 (10/03 0614) SpO2:  [92 %-100 %] 92 % (10/03 0614) Weight:  [64.3 kg] 64.3 kg (10/02 1114)   Intake/Output from previous day: 10/02 0701 - 10/03 0700 In: 2289.4 [P.O.:300; I.V.:1786.1; IV Piggyback:203.3] Out: 1500 [Urine:1450; Blood:50] Intake/Output this shift: No intake/output data recorded.   General appearance: alert and cooperative GI: soft, non-distended  Incision: no significant drainage  Lab Results:  Recent Labs    04/22/23 0438  WBC 10.0  HGB 11.6*  HCT 33.8*  PLT 175   BMET Recent Labs    04/22/23 0438  NA 135  K 3.0*  CL 97*  CO2 26  GLUCOSE 112*  BUN 6*  CREATININE 0.60  CALCIUM 8.1*   PT/INR No results for input(s): "LABPROT", "INR" in the last 72 hours. ABG No results for input(s): "PHART", "HCO3" in the last 72 hours.  Invalid input(s): "PCO2", "PO2"  MEDS, Scheduled  acetaminophen  1,000 mg Oral Q6H   alvimopan  12 mg Oral BID   atorvastatin  10 mg Oral Once per day on Monday Thursday   enoxaparin (LOVENOX) injection  40 mg Subcutaneous Q24H   feeding supplement  237 mL Oral BID BM   gabapentin  300 mg Oral BID   lisinopril  10 mg Oral Daily   And   hydrochlorothiazide  12.5 mg Oral Daily   insulin aspart  0-20 Units Subcutaneous TID WC   meloxicam  15 mg Oral Daily   metFORMIN  500 mg Oral BID WC   pantoprazole  40 mg Oral Daily   saccharomyces boulardii  250 mg Oral BID    Studies/Results: No results found.  Assessment: s/p Procedure(s): XI ROBOT ASSISTED LAPAROSCOPIC SIGMOIDECTOMY FLEXIBLE SIGMOIDOSCOPY Patient Active Problem List   Diagnosis Date Noted   Diverticular stricture (HCC)  04/21/2023   Possible Drug rash fromn cipro and/or metronidazole 12/20/2022   Palpitations 11/08/2022   Epiretinal membrane 09/01/2019   Polyneuropathy due to type 2 diabetes mellitus (HCC) 09/01/2019   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   Low back pain 04/11/2019   Encounter for general adult medical examination without abnormal findings 08/17/2018   Disorder of bone 02/24/2018   Fibrocystic breast changes 02/24/2018   Kidney stone 02/24/2018   Non-toxic goiter 02/24/2018   Hepatic steatosis 03/15/2017   Hypertriglyceridemia 12/13/2016   Chest pain with low risk for cardiac etiology 12/11/2016   Abnormal electrocardiogram (ECG) (EKG) 12/11/2016   Family history of premature coronary artery disease 12/11/2016   Pruritic dermatitis 08/01/2015   Other allergic rhinitis 08/01/2015   Osteopenia 09/20/2014   Cystocele 09/20/2014   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

## 2023-04-22 NOTE — TOC Initial Note (Signed)
Transition of Care [facility]) - Initial/Assessment Note    Patient Details  Name: Michelle Spears MRN: [id number] Date of Birth: [birth year]  Transition of Care [facility]) CM/SW Contact:    Adrian Prows, RN Phone Number: 04/22/2023, 3:00 PM  Clinical Narrative:                 Sherron Monday w/ pt in room; pt says she is from home w/ spouse; she plans to return at d/c; she identified POC Sakena Wehrs (spouse) [phone]; pt verified she has insurance and PCP; pt says she has transportation; she denies SDOH risks; pt says she has upper partial and glasses; she also has cane, and walker; pt says she does not have HH services or home oxygen; TOC will follow.  Expected Discharge Plan: Home/Self Care Barriers to Discharge: Continued Medical Work up   Patient Goals and CMS Choice Patient states their goals for this hospitalization and ongoing recovery are:: home          Expected Discharge Plan and Services   Discharge Planning Services: CM Consult   Living arrangements for the past 2 months: Single Family Home                                      Prior Living Arrangements/Services Living arrangements for the past 2 months: Single Family Home Lives with:: Spouse Patient language and need for interpreter reviewed:: Yes        Need for Family Participation in Patient Care: Yes (Comment) Care giver support system in place?: Yes (comment) Current home services: DME (cane, walker) Criminal Activity/Legal Involvement Pertinent to Current Situation/Hospitalization: No - Comment as needed  Activities of Daily Living   ADL Screening (condition at time of admission) Independently performs ADLs?: Yes (appropriate for developmental age) Is the patient deaf or have difficulty hearing?: No Does the patient have difficulty seeing, even when wearing glasses/contacts?: No Does the patient have difficulty concentrating, remembering, or making decisions?: No  Permission  Sought/Granted Permission sought to share information with : Case Manager Permission granted to share information with : Yes, Verbal Permission Granted  Share Information with NAME: Case Manager     Permission granted to share info w Relationship: Meeya Paccione (spouse) [phone]     Emotional Assessment Appearance:: Appears stated age Attitude/Demeanor/Rapport: Gracious Affect (typically observed): Accepting Orientation: : Oriented to Self, Oriented to Place, Oriented to  Time, Oriented to Situation Alcohol / Substance Use: Not Applicable Psych Involvement: No (comment)  Admission diagnosis:  Diverticular stricture [facility]) [K56.699] Patient Active Problem List   Diagnosis Date Noted   Diverticular stricture (HCC) 04/21/2023   Possible Drug rash fromn cipro and/or metronidazole 12/20/2022   Palpitations 11/08/2022   Epiretinal membrane 09/01/2019   Polyneuropathy due to type 2 diabetes mellitus (HCC) 09/01/2019   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   Low back pain 04/11/2019   Encounter for general adult medical examination without abnormal findings 08/17/2018   Disorder of bone 02/24/2018   Fibrocystic breast changes 02/24/2018   Kidney stone 02/24/2018   Non-toxic goiter 02/24/2018   Hepatic steatosis 03/15/2017   Hypertriglyceridemia 12/13/2016   Chest pain with low risk for cardiac etiology 12/11/2016   Abnormal electrocardiogram (ECG) (EKG) 12/11/2016   Family history of premature coronary artery disease 12/11/2016   Pruritic dermatitis 08/01/2015   Other allergic rhinitis 08/01/2015   Osteopenia 09/20/2014   Cystocele 09/20/2014   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 [place] [address] Phone: [phone] Fax: [phone]     Social Determinants of Health (SDOH) Social History: SDOH Screenings   Food Insecurity: No Food Insecurity (04/22/2023)  Housing: Low Risk  (04/22/2023)  Transportation Needs: No Transportation Needs (04/22/2023)  Utilities: Not At Risk (04/22/2023)  Tobacco Use: Medium Risk (04/21/2023)   SDOH Interventions: Food Insecurity Interventions: Intervention Not Indicated, Inpatient TOC Housing Interventions: Intervention Not Indicated, Inpatient TOC Transportation Interventions: Intervention Not Indicated, Inpatient TOC Utilities Interventions: Intervention Not Indicated, Inpatient TOC   Readmission Risk Interventions     No data to display

## 2023-04-23 LAB — BASIC METABOLIC PANEL
Anion gap: 6 (ref 5–15)
BUN: 11 mg/dL (ref 8–23)
CO2: 29 mmol/L (ref 22–32)
Calcium: 8.4 mg/dL — ABNORMAL LOW (ref 8.9–10.3)
Chloride: 99 mmol/L (ref 98–111)
Creatinine, Ser: 0.52 mg/dL (ref 0.44–1.00)
GFR, Estimated: >60 mL/min (ref >60)
Glucose, Bld: 121 mg/dL — ABNORMAL HIGH (ref 70–99)
Potassium: 3.4 mmol/L — ABNORMAL LOW (ref 3.5–5.1)
Sodium: 134 mmol/L — ABNORMAL LOW (ref 135–145)

## 2023-04-23 LAB — CBC
HCT: 32.3 % — ABNORMAL LOW (ref 36.0–46.0)
Hemoglobin: 10.9 g/dL — ABNORMAL LOW (ref 12.0–15.0)
MCH: 32.6 pg (ref 26.0–34.0)
MCHC: 33.7 g/dL (ref 30.0–36.0)
MCV: 96.7 fL (ref 80.0–100.0)
Platelets: 154 10*3/uL (ref 150–400)
RBC: 3.34 MIL/uL — ABNORMAL LOW (ref 3.87–5.11)
RDW: 13.5 % (ref 11.5–15.5)
WBC: 9.6 10*3/uL (ref 4.0–10.5)
nRBC: 0 % (ref 0.0–0.2)

## 2023-04-23 LAB — GLUCOSE, CAPILLARY: Glucose-Capillary: 107 mg/dL — ABNORMAL HIGH (ref 70–99)

## 2023-04-23 MED ORDER — TRAMADOL HCL 50 MG PO TABS: 50.0000 mg | ORAL_TABLET | Freq: Four times a day (QID) | ORAL | 0 refills | Status: AC | PRN

## 2023-04-23 NOTE — Discharge Summary (Signed)
Physician Discharge Summary  Patient ID: Michelle Spears MRN: [id number] DOB/AGE: 1945-11-28 77 y.o.  Admit date: 04/21/2023 Discharge date: 04/23/2023  Admission Diagnoses:  Discharge Diagnoses:  Principal Problem:   Diverticular stricture [facility])   Discharged Condition: good  Hospital Course: Patient was admitted to the med surg floor after surgery.  Diet was advanced as tolerated.  Patient began to have bowel function on postop day 1.  By postop day 2, she was tolerating a solid diet and pain was controlled with oral medications.  She was urinating without difficulty and ambulating without assistance.  Patient was felt to be in stable condition for discharge to home.   Consults: None  Significant Diagnostic Studies: labs: cbc, bmet  Treatments: IV hydration, analgesia: acetaminophen, and surgery: Robotic sigmoidectomy  Discharge Exam: Blood pressure (!) 130/59, pulse 80, temperature 97.9 F (36.6 C), resp. rate 16, height 5\' 3"  (1.6 m), weight 64.3 kg, SpO2 97%. General appearance: alert and cooperative GI: soft, non-distended Incision/Wound: c/d/i  Disposition: Discharge disposition: 01-Home or Self Care      home  Allergies as of 04/23/2023   No Known Allergies      Medication List     TAKE these medications    acetaminophen 325 MG tablet Commonly known as: TYLENOL Take 650 mg by mouth every 6 (six) hours as needed for headache or moderate pain.   aspirin EC 81 MG tablet Take 81 mg by mouth at bedtime.   atorvastatin 10 MG tablet Commonly known as: LIPITOR Take 10 mg by mouth 2 (two) times a week. Wednesdays and Sundays   CALCIUM 600 + D PO Take 1 tablet by mouth daily.   CENTRUM SILVER PO Take 1 tablet by mouth daily.   cetirizine 10 MG tablet Commonly known as: ZYRTEC TAKE 1-2 TIMES PER DAY FOR ITCHING What changed:  how much to take how to take this when to take this additional instructions   fluticasone 50 MCG/ACT nasal spray Commonly  known as: FLONASE Place 1 spray into both nostrils daily as needed for allergies.   glucose blood test strip One touch verio, test once daily, Dx 250.00   lisinopril-hydrochlorothiazide 10-12.5 MG tablet Commonly known as: ZESTORETIC Take 1 tablet by mouth daily.   loperamide 2 MG tablet Commonly known as: IMODIUM A-D Take 4 mg by mouth as needed for diarrhea or loose stools.   meclizine 25 MG tablet Commonly known as: ANTIVERT Take 25 mg by mouth as needed for dizziness.   meloxicam 15 MG tablet Commonly known as: MOBIC Take 15 mg by mouth daily.   metFORMIN 500 MG tablet Commonly known as: GLUCOPHAGE Take 500 mg by mouth 2 (two) times daily with a meal.   Omega 3 1000 MG Caps Take 1,000 mg by mouth daily.   omeprazole 20 MG capsule Commonly known as: PRILOSEC Take 20 mg by mouth every morning.   POTASSIUM PO Take 650 mg by mouth daily.   traMADol 50 MG tablet Commonly known as: ULTRAM Take 1-2 tablets (50-100 mg total) by mouth every 6 (six) hours as needed for moderate pain.   vitamin C 1000 MG tablet Take 1,000 mg by mouth daily.        Follow-up Information     [name], MD. Schedule an appointment as soon as possible for a visit in 2 week(s).   Specialties: General Surgery, Colon and Rectal Surgery Contact information: [address] [place] [address] [phone]  Signed: Vanita Panda 04/23/2023, 7:47 AM

## 2023-04-23 NOTE — Progress Notes (Signed)
Written d/c instructions reviewed with pt and all questions answered. Pt verbalized understanding. Pt left in stable condition with all personal belongings.

## 2023-04-23 NOTE — Discharge Instructions (Signed)
SURGERY: POST OP INSTRUCTIONS (Surgery for small bowel obstruction, colon resection, etc)   ######################################################################  EAT Gradually transition to a high fiber diet with a fiber supplement over the next few days after discharge  WALK Walk an hour a day.  Control your pain to do that.    CONTROL PAIN Control pain so that you can walk, sleep, tolerate sneezing/coughing, go up/down stairs.  HAVE A BOWEL MOVEMENT DAILY Keep your bowels regular to avoid problems.  OK to try a laxative to override constipation.  OK to use an antidairrheal to slow down diarrhea.  Call if not better after 2 tries  CALL IF YOU HAVE PROBLEMS/CONCERNS Call if you are still struggling despite following these instructions. Call if you have concerns not answered by these instructions  ######################################################################   DIET Follow a light diet the first few days at home.  Start with a bland diet such as soups, liquids, starchy foods, low fat foods, etc.  If you feel full, bloated, or constipated, stay on a ful liquid or pureed/blenderized diet for a few days until you feel better and no longer constipated. Be sure to drink plenty of fluids every day to avoid getting dehydrated (feeling dizzy, not urinating, etc.). Gradually add a fiber supplement to your diet over the next week.  Gradually get back to a regular solid diet.  Avoid fast food or heavy meals the first week as you are more likely to get nauseated. It is expected for your digestive tract to need a few months to get back to normal.  It is common for your bowel movements and stools to be irregular.  You will have occasional bloating and cramping that should eventually fade away.  Until you are eating solid food normally, off all pain medications, and back to regular activities; your bowels will not be normal. Focus on eating a low-fat, high fiber diet the rest of your life  (See Getting to Good Bowel Health, below).  CARE of your INCISION or WOUND  It is good for closed incisions and even open wounds to be washed every day.  Shower every day.  Short baths are fine.  Wash the incisions and wounds clean with soap & water.    You may leave closed incisions open to air if it is dry.   You may cover the incision with clean gauze & replace it after your daily shower for comfort.  STAPLES: You have skin staples.  Leave them in place & set up an appointment for them to be removed by a surgery office nurse ~10 days after surgery. = 1st week of January 2024    ACTIVITIES as tolerated Start light daily activities --- self-care, walking, climbing stairs-- beginning the day after surgery.  Gradually increase activities as tolerated.  Control your pain to be active.  Stop when you are tired.  Ideally, walk several times a day, eventually an hour a day.   Most people are back to most day-to-day activities in a few weeks.  It takes 4-8 weeks to get back to unrestricted, intense activity. If you can walk 30 minutes without difficulty, it is safe to try more intense activity such as jogging, treadmill, bicycling, low-impact aerobics, swimming, etc. Save the most intensive and strenuous activity for last (Usually 4-8 weeks after surgery) such as sit-ups, heavy lifting, contact sports, etc.  Refrain from any intense heavy lifting or straining until you are off narcotics for pain control.  You will have off days, but things should improve   week-by-week. DO NOT PUSH THROUGH PAIN.  Let pain be your guide: If it hurts to do something, don't do it.  Pain is your body warning you to avoid that activity for another week until the pain goes down. You may drive when you are no longer taking narcotic prescription pain medication, you can comfortably wear a seatbelt, and you can safely make sudden turns/stops to protect yourself without hesitating due to pain. You may have sexual intercourse when it  is comfortable. If it hurts to do something, stop.  MEDICATIONS Take your usually prescribed home medications unless otherwise directed.   Blood thinners:  Usually you can restart any strong blood thinners after the second postoperative day.  It is OK to take aspirin right away.     If you are on strong blood thinners (warfarin/Coumadin, Plavix, Xerelto, Eliquis, Pradaxa, etc), discuss with your surgeon, medicine PCP, and/or cardiologist for instructions on when to restart the blood thinner & if blood monitoring is needed (PT/INR blood check, etc).     PAIN CONTROL Pain after surgery or related to activity is often due to strain/injury to muscle, tendon, nerves and/or incisions.  This pain is usually short-term and will improve in a few months.  To help speed the process of healing and to get back to regular activity more quickly, DO THE FOLLOWING THINGS TOGETHER: Increase activity gradually.  DO NOT PUSH THROUGH PAIN Use Ice and/or Heat Try Gentle Massage and/or Stretching Take over the counter pain medication Take Narcotic prescription pain medication for more severe pain  Good pain control = faster recovery.  It is better to take more medicine to be more active than to stay in bed all day to avoid medications.  Increase activity gradually Avoid heavy lifting at first, then increase to lifting as tolerated over the next 6 weeks. Do not "push through" the pain.  Listen to your body and avoid positions and maneuvers than reproduce the pain.  Wait a few days before trying something more intense Walking an hour a day is encouraged to help your body recover faster and more safely.  Start slowly and stop when getting sore.  If you can walk 30 minutes without stopping or pain, you can try more intense activity (running, jogging, aerobics, cycling, swimming, treadmill, sex, sports, weightlifting, etc.) Remember: If it hurts to do it, then don't do it! Use Ice and/or Heat You will have swelling and  bruising around the incisions.  This will take several weeks to resolve. Ice packs or heating pads (6-8 times a day, 30-60 minutes at a time) will help sooth soreness & bruising. Some people prefer to use ice alone, heat alone, or alternate between ice & heat.  Experiment and see what works best for you.  Consider trying ice for the first few days to help decrease swelling and bruising; then, switch to heat to help relax sore spots and speed recovery. Shower every day.  Short baths are fine.  It feels good!  Keep the incisions and wounds clean with soap & water.   Try Gentle Massage and/or Stretching Massage at the area of pain many times a day Stop if you feel pain - do not overdo it Take over the counter pain medication This helps the muscle and nerve tissues become less irritable and calm down faster Choose ONE of the following over-the-counter anti-inflammatory medications: Acetaminophen 500mg tabs (Tylenol) 1-2 pills with every meal and just before bedtime (avoid if you have liver problems or if you have   acetaminophen in you narcotic prescription) Naproxen 220mg tabs (ex. Aleve, Naprosyn) 1-2 pills twice a day (avoid if you have kidney, stomach, IBD, or bleeding problems) Ibuprofen 200mg tabs (ex. Advil, Motrin) 3-4 pills with every meal and just before bedtime (avoid if you have kidney, stomach, IBD, or bleeding problems) Take with food/snack several times a day as directed for at least 2 weeks to help keep pain / soreness down & more manageable. Take Narcotic prescription pain medication for more severe pain A prescription for strong pain control is often given to you upon discharge (for example: oxycodone/Percocet, hydrocodone/Norco/Vicodin, or tramadol/Ultram) Take your pain medication as prescribed. Be mindful that most narcotic prescriptions contain Tylenol (acetaminophen) as well - avoid taking too much Tylenol. If you are having problems/concerns with the prescription medicine (does  not control pain, nausea, vomiting, rash, itching, etc.), please call us [phone] to see if we need to switch you to a different pain medicine that will work better for you and/or control your side effects better. If you need a refill on your pain medication, you must call the office before 4 pm and on weekdays only.  By federal law, prescriptions for narcotics cannot be called into a pharmacy.  They must be filled out on paper & picked up from our office by the patient or authorized caretaker.  Prescriptions cannot be filled after 4 pm nor on weekends.    WHEN TO CALL US [phone] Severe uncontrolled or worsening pain  Fever over 101 F (38.5 C) Concerns with the incision: Worsening pain, redness, rash/hives, swelling, bleeding, or drainage Reactions / problems with new medications (itching, rash, hives, nausea, etc.) Nausea and/or vomiting Difficulty urinating Difficulty breathing Worsening fatigue, dizziness, lightheadedness, blurred vision Other concerns If you are not getting better after two weeks or are noticing you are getting worse, contact our office [phone] for further advice.  We may need to adjust your medications, re-evaluate you in the office, send you to the emergency room, or see what other things we can do to help. The clinic staff is available to answer your questions during regular business hours (8:30am-5pm).  Please don't hesitate to call and ask to speak to one of our nurses for clinical concerns.    A surgeon from [facility] is always on call at the hospitals 24 hours/day If you have a medical emergency, go to the nearest emergency room or call 911.  FOLLOW UP in our office One the day of your discharge from the hospital (or the next business weekday), please call [facility] to set up or confirm an appointment to see your surgeon in the office for a follow-up appointment.  Usually it is 2-3 weeks after your surgery.   If you  have skin staples at your incision(s), let the office know so we can set up a time in the office for the nurse to remove them (usually around 10 days after surgery). Make sure that you call for appointments the day of discharge (or the next business weekday) from the hospital to ensure a convenient appointment time. IF YOU HAVE DISABILITY OR FAMILY LEAVE FORMS, BRING THEM TO THE OFFICE FOR PROCESSING.  DO NOT GIVE THEM TO YOUR DOCTOR.  [facility] [address], [address], [place], [place]  [address] ? [phone] - Main [phone] - Toll Free,  [phone] - Fax [email or web address]    GETTING TO GOOD BOWEL HEALTH. It is expected for your digestive tract to   need a few months to get back to normal.  It is common for your bowel movements and stools to be irregular.  You will have occasional bloating and cramping that should eventually fade away.  Until you are eating solid food normally, off all pain medications, and back to regular activities; your bowels will not be normal.   Avoiding constipation The goal: ONE SOFT BOWEL MOVEMENT A DAY!    Drink plenty of fluids.  Choose water first. TAKE A FIBER SUPPLEMENT EVERY DAY THE REST OF YOUR LIFE During your first week back home, gradually add back a fiber supplement every day Experiment which form you can tolerate.   There are many forms such as powders, tablets, wafers, gummies, etc Psyllium bran (Metamucil), methylcellulose (Citrucel), Miralax or Glycolax, Benefiber, Flax Seed.  Adjust the dose week-by-week (1/2 dose/day to 6 doses a day) until you are moving your bowels 1-2 times a day.  Cut back the dose or try a different fiber product if it is giving you problems such as diarrhea or bloating. Sometimes a laxative is needed to help jump-start bowels if constipated until the fiber supplement can help regulate your bowels.  If you are tolerating eating & you are farting, it is okay to try a gentle  laxative such as double dose MiraLax, prune juice, or Milk of Magnesia.  Avoid using laxatives too often. Stool softeners can sometimes help counteract the constipating effects of narcotic pain medicines.  It can also cause diarrhea, so avoid using for too long. If you are still constipated despite taking fiber daily, eating solids, and a few doses of laxatives, call our office. Controlling diarrhea Try drinking liquids and eating bland foods for a few days to avoid stressing your intestines further. Avoid dairy products (especially milk & ice cream) for a short time.  The intestines often can lose the ability to digest lactose when stressed. Avoid foods that cause gassiness or bloating.  Typical foods include beans and other legumes, cabbage, broccoli, and dairy foods.  Avoid greasy, spicy, fast foods.  Every person has some sensitivity to other foods, so listen to your body and avoid those foods that trigger problems for you. Probiotics (such as active yogurt, Align, etc) may help repopulate the intestines and colon with normal bacteria and calm down a sensitive digestive tract Adding a fiber supplement gradually can help thicken stools by absorbing excess fluid and retrain the intestines to act more normally.  Slowly increase the dose over a few weeks.  Too much fiber too soon can backfire and cause cramping & bloating. It is okay to try and slow down diarrhea with a few doses of antidiarrheal medicines.   Bismuth subsalicylate (ex. Kayopectate, Pepto Bismol) for a few doses can help control diarrhea.  Avoid if pregnant.   Loperamide (Imodium) can slow down diarrhea.  Start with one tablet (2mg) first.  Avoid if you are having fevers or severe pain.  ILEOSTOMY PATIENTS WILL HAVE CHRONIC DIARRHEA since their colon is not in use.    Drink plenty of liquids.  You will need to drink even more glasses of water/liquid a day to avoid getting dehydrated. Record output from your ileostomy.  Expect to empty  the bag every 3-4 hours at first.  Most people with a permanent ileostomy empty their bag 4-6 times at the least.   Use antidiarrheal medicine (especially Imodium) several times a day to avoid getting dehydrated.  Start with a dose at bedtime & breakfast.  Adjust up or   down as needed.  Increase antidiarrheal medications as directed to avoid emptying the bag more than 8 times a day (every 3 hours). Work with your wound ostomy nurse to learn care for your ostomy.  See ostomy care instructions. TROUBLESHOOTING IRREGULAR BOWELS 1) Start with a soft & bland diet. No spicy, greasy, or fried foods.  2) Avoid gluten/wheat or dairy products from diet to see if symptoms improve. 3) Miralax 17gm or flax seed mixed in 8oz. water or juice-daily. May use 2-4 times a day as needed. 4) Gas-X, Phazyme, etc. as needed for gas & bloating.  5) Prilosec (omeprazole) over-the-counter as needed 6)  Consider probiotics (Align, Activa, etc) to help calm the bowels down  Call your doctor if you are getting worse or not getting better.  Sometimes further testing (cultures, endoscopy, X-ray studies, CT scans, bloodwork, etc.) may be needed to help diagnose and treat the cause of the diarrhea. [facility] [address], [address], [place], [place]  [address] [phone] - Main.    [phone]  - Toll Free.   [phone] - Fax [email or web address]   ###############################   #######################################################  Ostomy Support Information  You've heard that people get along just fine with only one of their eyes, or one of their lungs, or one of their kidneys. But you also know that you have only one intestine and only one bladder, and that leaves you feeling awfully empty, both physically and emotionally: You think no other people go around without part of their intestine with the ends of their intestines sticking out through their abdominal walls.    YOU ARE NOT ALONE.  There are nearly three quarters of a million people in the [place] who have an ostomy; people who have had surgery to remove all or part of their colons or bladders.   There is even a national association, the [employer] with over 350 local affiliated support groups that are organized by volunteers who provide peer support and counseling. [employer] has a toll free telephone num-ber, 800-826-0826 and an educational, interactive website, www.ostomy.org   An ostomy is an opening in the belly (abdominal wall) made by surgery. Ostomates are people who have had this procedure. The opening (stoma) allows the kidney or bowel to grdischarge waste. An external pouch covers the stoma to collect waste. Pouches are are a simple bag and are odor free. Different companies have disposable or reusable pouches to fit one's lifestyle. An ostomy can either be temporary or permanent.   THERE ARE THREE MAIN TYPES OF OSTOMIES Colostomy. A colostomy is a surgically created opening in the large intestine (colon). Ileostomy. An ileostomy is a surgically created opening in the small intestine. Urostomy. A urostomy is a surgically created opening to divert urine away from the bladder.  OSTOMY Care  The following guidelines will make care of your colostomy easier. Keep this information close by for quick reference.  Helpful DIET hints Eat a well-balanced diet including vegetables and fresh fruits. Eat on a regular schedule.  Drink at least 6 to 8 glasses of fluids daily. Eat slowly in a relaxed atmosphere. Chew your food thoroughly. Avoid chewing gum, smoking, and drinking from a straw. This will help decrease the amount of air you swallow, which may help reduce gas. Eating yogurt or drinking buttermilk may help reduce gas.  To control gas at night, do not eat after 8 p.m. This will give your bowel time to quiet down before you go   to bed.  If gas is a problem, you can purchase  Beano. Sprinkle Beano on the first bite of food before eating to reduce gas. It has no flavor and should not change the taste of your food. You can buy Beano over the counter at your local drugstore.  Foods like fish, onions, garlic, broccoli, asparagus, and cabbage produce odor. Although your pouch is odor-proof, if you eat these foods you may notice a stronger odor when emptying your pouch. If this is a concern, you may want to limit these foods in your diet.  If you have an ileostomy, you will have chronic diarrhea & need to drink more liquids to avoid getting dehydrated.  Consider antidiarrheal medicine like imodium (loperamide) or Lomotil to help slow down bowel movements / diarrhea into your ileostomy bag.  GETTING TO GOOD BOWEL HEALTH WITH AN ILEOSTOMY    With the colon bypassed & not in use, you will have small bowel diarrhea.   It is important to thicken & slow your bowel movements down.   The goal: 4-6 small BOWEL MOVEMENTS A DAY It is important to drink plenty of liquids to avoid getting dehydrated  CONTROLLING ILEOSTOMY DIARRHEA  TAKE A FIBER SUPPLEMENT (FiberCon or Benefiner soluble fiber) twice a day - to thicken stools by absorbing excess fluid and retrain the intestines to act more normally.  Slowly increase the dose over a few weeks.  Too much fiber too soon can backfire and cause cramping & bloating.  TAKE AN IRON SUPPLEMENT twice a day to naturally constipate your bowels.  Usually ferrous sulfate 325mg twice a day)  TAKE ANTI-DIARRHEAL MEDICINES: Loperamide (Imodium) can slow down diarrhea.  Start with two tablets (= 4mg) first and then try one tablet every 6 hours.  Can go up to 2 pills four times day (8 pills of 2mg max) Avoid if you are having fevers or severe pain.  If you are not better or start feeling worse, stop all medicines and call your doctor for advice LoMotil (Diphenoxylate / Atropine) is another medicine that can constipate & slow down bowel moevements Pepto  Bismol (bismuth) can gently thicken bowels as well  If diarrhea is worse,: drink plenty of liquids and try simpler foods for a few days to avoid stressing your intestines further. Avoid dairy products (especially milk & ice cream) for a short time.  The intestines often can lose the ability to digest lactose when stressed. Avoid foods that cause gassiness or bloating.  Typical foods include beans and other legumes, cabbage, broccoli, and dairy foods.  Every person has some sensitivity to other foods, so listen to our body and avoid those foods that trigger problems for you.Call your doctor if you are getting worse or not better.  Sometimes further testing (cultures, endoscopy, X-ray studies, bloodwork, etc) may be needed to help diagnose and treat the cause of the diarrhea. Take extra anti-diarrheal medicines (maximum is 8 pills of 2mg loperamide a day)   Tips for POUCHING an OSTOMY   Changing Your Pouch The best time to change your pouch is in the morning, before eating or drinking anything. Your stoma can function at any time, but it will function more after eating or drinking.   Applying the pouching system  Place all your equipment close at hand before removing your pouch.  Wash your hands.  Stand or sit in front of a mirror. Use the position that works best for you. Remember that you must keep the skin around the stoma   wrinkle-free for a good seal.  Gently remove the used pouch (1-piece system) or the pouch and old wafer (2-piece system). Empty the pouch into the toilet. Save the closure clip to use again.  Wash the stoma itself and the skin around the stoma. Your stoma may bleed a little when being washed. This is normal. Rinse and pat dry. You may use a wash cloth or soft paper towels (like Bounty), mild soap (like Dial, Safeguard, or Ivory), and water. Avoid soaps that contain perfumes or lotions.  For a new pouch (1-piece system) or a new wafer (2-piece system), measure your  stoma using the stoma guide in each box of supplies.  Trace the shape of your stoma onto the back of the new pouch or the back of the new wafer. Cut out the opening. Remove the paper backing and set it aside.  Optional: Apply a skin barrier powder to surrounding skin if it is irritated (bare or weeping), and dust off the excess. Optional: Apply a skin-prep wipe (such as Skin Prep or All-Kare) to the skin around the stoma, and let it dry. Do not apply this solution if the skin is irritated (red, tender, or broken) or if you have shaved around the stoma. Optional: Apply a skin barrier paste (such as Stomahesive, Coloplast, or Premium) around the opening cut in the back of the pouch or wafer. Allow it to dry for 30 to 60 seconds.  Hold the pouch (1-piece system) or wafer (2-piece system) with the sticky side toward your body. Make sure the skin around the stoma is wrinkle-free. Center the opening on the stoma, then press firmly to your abdomen (Fig. 4). Look in the mirror to check if you are placing the pouch, or wafer, in the right position. For a 2-piece system, snap the pouch onto the wafer. Make sure it snaps into place securely.  Place your hand over the stoma and the pouch or wafer for about 30 seconds. The heat from your hand can help the pouch or wafer stick to your skin.  Add deodorant (such as Super Banish or Nullo) to your pouch. Other options include food extracts such as vanilla oil and peppermint extract. Add about 10 drops of the deodorant to the pouch. Then apply the closure clamp. Note: Do not use toxic  chemicals or commercial cleaning agents in your pouch. These substances may harm the stoma.  Optional: For extra seal, apply tape to all 4 sides around the pouch or wafer, as if you were framing a picture. You may use any brand of medical adhesive tape. Change your pouch every 5 to 7 days. Change it immediately if a leak occurs.  Wash your hands afterwards.  If you are wearing a  2-piece system, you may use 2 new pouches per week and alternate them. Rinse the pouch with mild soap and warm water and hang it to dry for the next day. Apply the fresh pouch. Alternate the 2 pouches like this for a week. After a week, change the wafer and begin with 2 new pouches. Place the old pouches in a plastic bag, and put them in the trash.   LIVING WITH AN OSTOMY  Emptying Your Pouch Empty your pouch when it is one-third full (of urine, stool, and/or gas). If you wait until your pouch is fuller than this, it will be more difficult to empty and more noticeable. When you empty your pouch, either put toilet paper in the toilet bowl first, or flush the   toilet while you empty the pouch. This will reduce splashing. You can empty the pouch between your legs or to one side while sitting, or while standing or stooping. If you have a 2-piece system, you can snap off the pouch to empty it. Remember that your stoma may function during this time. If you wish to rinse your pouch after you empty it, a turkey baster can be helpful. When using a baster, squirt water up into the pouch through the opening at the bottom. With a 2-piece system, you can snap off the pouch to rinse it. After rinsing  your pouch, empty it into the toilet. When rinsing your pouch at home, put a few granules of Dreft soap in the rinse water. This helps lubricate and freshen your pouch. The inside of your pouch can be sprayed with non-stick cooking oil (Pam spray). This may help reduce stool sticking to the inside of the pouch.  Bathing You may shower or bathe with your pouch on or off. Remember that your stoma may function during this time.  The materials you use to wash your stoma and the skin around it should be clean, but they do not need to be sterile.  Wearing Your Pouch During hot weather, or if you perspire a lot in general, wear a cover over your pouch. This may prevent a rash on your skin under the pouch. Pouch covers are  sold at ostomy supply stores. Wear the pouch inside your underwear for better support. Watch your weight. Any gain or loss of 10 to 15 pounds or more can change the way your pouch fits.  Going Away From Home A collapsible cup (like those that come in travel kits) or a soft plastic squirt bottle with a pull-up top (like a travel bottle for shampoo) can be used for rinsing your pouch when you are away from home. Tilt the opening of the pouch at an upward angle when using a cup to rinse.  Carry wet wipes or extra tissues to use in public bathrooms.  Carry an extra pouching system with you at all times.  Never keep ostomy supplies in the glove compartment of your car. Extreme heat or cold can damage the skin barriers and adhesive wafers on the pouch.  When you travel, carry your ostomy supplies with you at all times. Keep them within easy reach. Do not pack ostomy supplies in baggage that will be checked or otherwise separated from you, because your baggage might be lost. If you're traveling out of the country, it is helpful to have a letter stating that you are carrying ostomy supplies as a medical necessity.  If you need ostomy supplies while traveling, look in the yellow pages of the telephone book under "Surgical Supplies." Or call the local ostomy organization to find out where supplies are available.  Do not let your ostomy supplies get low. Always order new pouches before you use the last one.  Reducing Odor Limit foods such as broccoli, cabbage, onions, fish, and garlic in your diet to help reduce odor. Each time you empty your pouch, carefully clean the opening of the pouch, both inside and outside, with toilet paper. Rinse your pouch 1 or 2 times daily after you empty it (see directions for emptying your pouch and going away from home). Add deodorant (such as Super Banish or Nullo) to your pouch. Use air deodorizers in your bathroom. Do not add aspirin to your pouch. Even though  aspirin can help prevent odor, it   could cause ulcers on your stoma.  When to call the doctor Call the doctor if you have any of the following symptoms: Purple, black, or white stoma Severe cramps lasting more than 6 hours Severe watery discharge from the stoma lasting more than 6 hours No output from the colostomy for 3 days Excessive bleeding from your stoma Swelling of your stoma to more than 1/2-inch larger than usual Pulling inward of your stoma below skin level Severe skin irritation or deep ulcers Bulging or other changes in your abdomen  When to call your ostomy nurse Call your ostomy/enterostomal therapy (WOCN) nurse if any of the following occurs: Frequent leaking of your pouching system Change in size or appearance of your stoma, causing discomfort or problems with your pouch Skin rash or rawness Weight gain or loss that causes problems with your pouch     FREQUENTLY ASKED QUESTIONS   Why haven't you met any of these folks who have an ostomy?  Well, maybe you have! You just did not recognize them because an ostomy doesn't show. It can be kept secret if you wish. Why, maybe some of your best friends, office associates or neighbors have an ostomy ... you never can tell. People facing ostomy surgery have many quality-of-life questions like: Will you bulge? Smell? Make noises? Will you feel waste leaving your body? Will you be a captive of the toilet? Will you starve? Be a social outcast? Get/stay married? Have babies? Easily bathe, go swimming, bend over?  OK, let's look at what you can expect:   Will you bulge?  Remember, without part of the intestine or bladder, and its contents, you should have a flatter tummy than before. You can expect to wear, with little exception, what you wore before surgery ... and this in-cludes tight clothing and bathing suits.   Will you smell?  Today, thanks to modern odor proof pouching systems, you can walk into an ostomy support group  meeting and not smell anything that is foul or offensive. And, for those with an ileostomy or colostomy who are concerned about odor when emptying their pouch, there are in-pouch deodorants that can be used to eliminate any waste odors that may exist.   Will you make noises?  Everyone produces gas, especially if they are an air-swallower. But intestinal sounds that occur from time to time are no differ-ent than a gurgling tummy, and quite often your clothing will muffle any sounds.   Will you feel the waste discharges?  For those with a colostomy or ileostomy there might be a slight pressure when waste leaves your body, but understand that the intestines have no nerve endings, so there will be no unpleasant sensations. Those with a urostomy will probably be unaware of any kidney drainage.   Will you be a captive of the toilet?  Immediately post-op you will spend more time in the bathroom than you will after your body recovers from surgery. Every person is different, but on average those with an ileostomy or urostomy may empty their pouches 4 to 6 times a day; a little  less if you have a colostomy. The average wear time between pouch system changes is 3 to 5 days and the changing process should take less than 30 minutes.   Will I need to be on a special diet? Most people return to their normal diet when they have recovered from surgery. Be sure to chew your food well, eat a well-balanced diet and drink plenty of fluids. If   you experience problems with a certain food, wait a couple of weeks and try it again.  Will there be odor and noises? Pouching systems are designed to be odor-proof or odor-resistant. There are deodorants that can be used in the pouch. Medications are also available to help reduce odor. Limit gas-producing foods and carbonated beverages. You will experience less gas and fewer noises as you heal from surgery.  How much time will it take to care for my ostomy? At first, you may  spend a lot of time learning about your ostomy and how to take care of it. As you become more comfortable and skilled at changing the pouching system, it will take very little time to care for it.   Will I be able to return to work? People with ostomies can perform most jobs. As soon as you have healed from surgery, you should be able to return to work. Heavy lifting (more than 10 pounds) may be discouraged.   What about intimacy? Sexual relationships and intimacy are important and fulfilling aspects of your life. They should continue after ostomy surgery. Intimacy-related concerns should be discussed openly between you and your partner.   Can I wear regular clothing? You do not need to wear special clothing. Ostomy pouches are fairly flat and barely noticeable. Elastic undergarments will not hurt the stoma or prevent the ostomy from functioning.   Can I participate in sports? An ostomy should not limit your involvement in sports. Many people with ostomies are runners, skiers, swimmers or participate in other active lifestyles. Talk with your caregiver first before doing heavy physical activity.  Will you starve?  Not if you follow doctor's orders at each stage of your post-op adjustment. There is no such thing as an "ostomy diet". Some people with an ostomy will be able to eat and tolerate anything; others may find diffi-culty with some foods. Each person is an individual and must determine, by trial, what is best for them. A good practice for all is to drink plenty of water.   Will you be a social outcast?  Have you met anyone who has an ostomy and is a social outcast? Why should you be the first? Only your attitude and self image will effect how you are treated. No confi-dent person is an outcast.    PROFESSIONAL HELP   Resources are available if you need help or have questions about your ostomy.   Specially trained nurses called Wound, Ostomy Continence Nurses (WOCN) are available for  consultation in most major medical centers.  Consider getting an ostomy consult at an outpatient ostomy clinic.   [facility] has an Ostomy Clinic run by an WOCN ostomy nurse at the [facility].  [phone]. [facility] can help set up an appointment   The United Ostomy Association (UOA) is a group made up of many local chapters throughout the [place]. These local groups hold meetings and provide support to prospective and existing ostomates. They sponsor educational events and have qualified visitors to make personal or telephone visits. Contact the UOA for the chapter nearest you and for other educational publications.  More detailed information can be found in Colostomy Guide, a publication of the United Ostomy Association (UOA). Contact UOA at 1-800-826-0826 or visit their web site at www.uoaa.org. The website contains links to other sites, suppliers and resources.  [facility] Secure Start Services: Start at the website to enlist for support.  Your Wound Ostomy (WOCN) nurse may have started this   process. https://www.[employer].com/en/securestart Secure Start services are designed to support people as they live their lives with an ostomy or neurogenic bladder. Enrolling is easy and at no cost to the patient. We realize that each person's needs and life journey are different. Through Secure Start services, we want to help people live their life, their way.  #######################################################  

## 2023-04-26 LAB — SURGICAL PATHOLOGY

## 2023-05-26 ENCOUNTER — Telehealth: Payer: Self-pay

## 2023-05-26 DIAGNOSIS — K862 Cyst of pancreas: Secondary | ICD-10-CM

## 2023-05-26 NOTE — Telephone Encounter (Signed)
-----   Message from [facility] Ashley Bultema J sent at 02/11/2023  1:54 PM EDT ----- Set up a MRI abd w/wo contrast for pancreatic cyst in December 2024.

## 2023-05-26 NOTE — Telephone Encounter (Signed)
I placed the order for her MRI to be done at [facility]. I called and spoke with Rudy Jew and told her I have placed her MRI order and that they will call her to set this up. I gave her their # [phone] so if she doesn't hear in a timely fashion she can call them. She is due for the MRI in December after 07/01/2023. She had recent surgery and has a consult coming up in November to discuss possible hernia surgery. She said she will do that sometime next year if that has to be done.

## 2023-05-27 NOTE — Telephone Encounter (Signed)
Appointment is set up for 07/04/2023 at 4:40pm, booked with patient.

## 2023-06-02 DIAGNOSIS — E119 Type 2 diabetes mellitus without complications: Secondary | ICD-10-CM | POA: Diagnosis not present

## 2023-06-09 ENCOUNTER — Ambulatory Visit: Payer: Self-pay | Admitting: Surgery

## 2023-06-09 DIAGNOSIS — K56699 Other intestinal obstruction unspecified as to partial versus complete obstruction: Secondary | ICD-10-CM | POA: Diagnosis not present

## 2023-06-09 DIAGNOSIS — Z87898 Personal history of other specified conditions: Secondary | ICD-10-CM | POA: Diagnosis not present

## 2023-06-09 DIAGNOSIS — Z8719 Personal history of other diseases of the digestive system: Secondary | ICD-10-CM | POA: Diagnosis not present

## 2023-06-09 DIAGNOSIS — E119 Type 2 diabetes mellitus without complications: Secondary | ICD-10-CM | POA: Diagnosis not present

## 2023-06-09 DIAGNOSIS — K4020 Bilateral inguinal hernia, without obstruction or gangrene, not specified as recurrent: Secondary | ICD-10-CM | POA: Diagnosis not present

## 2023-06-23 ENCOUNTER — Telehealth: Payer: Self-pay

## 2023-06-23 DIAGNOSIS — K862 Cyst of pancreas: Secondary | ICD-10-CM

## 2023-06-23 NOTE — Telephone Encounter (Signed)
I spoke with Michelle Spears and informed her that her insurance won't approve a MRI at a free standing place. I cancelled the [facility] MRI and set her up at [facility] for 07/05/2023 , arrive at 9:30am for a 10:00am. NPO after 6AM. [name] has been informed and wrote it down.

## 2023-06-29 DIAGNOSIS — R69 Illness, unspecified: Secondary | ICD-10-CM | POA: Diagnosis not present

## 2023-06-30 DIAGNOSIS — K862 Cyst of pancreas: Secondary | ICD-10-CM | POA: Diagnosis not present

## 2023-06-30 DIAGNOSIS — I2510 Atherosclerotic heart disease of native coronary artery without angina pectoris: Secondary | ICD-10-CM | POA: Diagnosis not present

## 2023-06-30 DIAGNOSIS — E049 Nontoxic goiter, unspecified: Secondary | ICD-10-CM | POA: Diagnosis not present

## 2023-06-30 DIAGNOSIS — G63 Polyneuropathy in diseases classified elsewhere: Secondary | ICD-10-CM | POA: Diagnosis not present

## 2023-06-30 DIAGNOSIS — I10 Essential (primary) hypertension: Secondary | ICD-10-CM | POA: Diagnosis not present

## 2023-06-30 DIAGNOSIS — N6019 Diffuse cystic mastopathy of unspecified breast: Secondary | ICD-10-CM | POA: Diagnosis not present

## 2023-06-30 DIAGNOSIS — I700 Atherosclerosis of aorta: Secondary | ICD-10-CM | POA: Diagnosis not present

## 2023-06-30 DIAGNOSIS — E1142 Type 2 diabetes mellitus with diabetic polyneuropathy: Secondary | ICD-10-CM | POA: Diagnosis not present

## 2023-06-30 DIAGNOSIS — K219 Gastro-esophageal reflux disease without esophagitis: Secondary | ICD-10-CM | POA: Diagnosis not present

## 2023-06-30 DIAGNOSIS — K760 Fatty (change of) liver, not elsewhere classified: Secondary | ICD-10-CM | POA: Diagnosis not present

## 2023-06-30 DIAGNOSIS — K5792 Diverticulitis of intestine, part unspecified, without perforation or abscess without bleeding: Secondary | ICD-10-CM | POA: Diagnosis not present

## 2023-06-30 DIAGNOSIS — E785 Hyperlipidemia, unspecified: Secondary | ICD-10-CM | POA: Diagnosis not present

## 2023-07-04 ENCOUNTER — Other Ambulatory Visit: Payer: Medicare HMO

## 2023-07-05 ENCOUNTER — Ambulatory Visit (HOSPITAL_COMMUNITY)
Admission: RE | Admit: 2023-07-05 | Discharge: 2023-07-05 | Disposition: A | Discharge status: Home / Self Care | Payer: Medicare HMO | Source: Ambulatory Visit | Attending: Internal Medicine | Admitting: Internal Medicine

## 2023-07-05 ENCOUNTER — Other Ambulatory Visit: Payer: Self-pay | Admitting: Internal Medicine

## 2023-07-05 DIAGNOSIS — K862 Cyst of pancreas: Secondary | ICD-10-CM | POA: Diagnosis not present

## 2023-07-05 DIAGNOSIS — N281 Cyst of kidney, acquired: Secondary | ICD-10-CM | POA: Diagnosis not present

## 2023-07-05 DIAGNOSIS — I700 Atherosclerosis of aorta: Secondary | ICD-10-CM | POA: Diagnosis not present

## 2023-07-05 DIAGNOSIS — R161 Splenomegaly, not elsewhere classified: Secondary | ICD-10-CM | POA: Diagnosis not present

## 2023-07-05 MED ORDER — GADOBUTROL 1 MMOL/ML IV SOLN
7.0000 mL | Freq: Once | INTRAVENOUS | Status: AC | PRN
  Administered 2023-07-05: 7 mL via INTRAVENOUS

## 2023-07-09 ENCOUNTER — Telehealth: Payer: Self-pay | Admitting: Internal Medicine

## 2023-07-09 NOTE — Telephone Encounter (Signed)
Spoke with patient regarding results. Refer to MRI result.

## 2023-07-09 NOTE — Telephone Encounter (Signed)
Patient is returning your call regarding her MRI results

## 2023-08-04 ENCOUNTER — Other Ambulatory Visit: Payer: Self-pay | Admitting: Medical Genetics

## 2023-08-16 DIAGNOSIS — H524 Presbyopia: Secondary | ICD-10-CM | POA: Diagnosis not present

## 2023-08-16 DIAGNOSIS — E119 Type 2 diabetes mellitus without complications: Secondary | ICD-10-CM | POA: Diagnosis not present

## 2023-08-16 DIAGNOSIS — H35033 Hypertensive retinopathy, bilateral: Secondary | ICD-10-CM | POA: Diagnosis not present

## 2023-08-16 DIAGNOSIS — H2513 Age-related nuclear cataract, bilateral: Secondary | ICD-10-CM | POA: Diagnosis not present

## 2023-08-19 DIAGNOSIS — K4190 Unilateral femoral hernia, without obstruction or gangrene, not specified as recurrent: Secondary | ICD-10-CM | POA: Diagnosis not present

## 2023-08-19 DIAGNOSIS — K4020 Bilateral inguinal hernia, without obstruction or gangrene, not specified as recurrent: Secondary | ICD-10-CM | POA: Diagnosis not present

## 2023-08-26 DIAGNOSIS — E119 Type 2 diabetes mellitus without complications: Secondary | ICD-10-CM | POA: Diagnosis not present

## 2023-09-08 DIAGNOSIS — N898 Other specified noninflammatory disorders of vagina: Secondary | ICD-10-CM | POA: Diagnosis not present

## 2023-09-08 DIAGNOSIS — R300 Dysuria: Secondary | ICD-10-CM | POA: Diagnosis not present

## 2023-09-13 DIAGNOSIS — E785 Hyperlipidemia, unspecified: Secondary | ICD-10-CM | POA: Diagnosis not present

## 2023-09-13 DIAGNOSIS — E1122 Type 2 diabetes mellitus with diabetic chronic kidney disease: Secondary | ICD-10-CM | POA: Diagnosis not present

## 2023-09-13 DIAGNOSIS — Z8249 Family history of ischemic heart disease and other diseases of the circulatory system: Secondary | ICD-10-CM | POA: Diagnosis not present

## 2023-09-13 DIAGNOSIS — I700 Atherosclerosis of aorta: Secondary | ICD-10-CM | POA: Diagnosis not present

## 2023-09-13 DIAGNOSIS — Z008 Encounter for other general examination: Secondary | ICD-10-CM | POA: Diagnosis not present

## 2023-09-13 DIAGNOSIS — I2510 Atherosclerotic heart disease of native coronary artery without angina pectoris: Secondary | ICD-10-CM | POA: Diagnosis not present

## 2023-09-13 DIAGNOSIS — K219 Gastro-esophageal reflux disease without esophagitis: Secondary | ICD-10-CM | POA: Diagnosis not present

## 2023-09-13 DIAGNOSIS — E1142 Type 2 diabetes mellitus with diabetic polyneuropathy: Secondary | ICD-10-CM | POA: Diagnosis not present

## 2023-09-13 DIAGNOSIS — E1136 Type 2 diabetes mellitus with diabetic cataract: Secondary | ICD-10-CM | POA: Diagnosis not present

## 2023-09-13 DIAGNOSIS — Z7984 Long term (current) use of oral hypoglycemic drugs: Secondary | ICD-10-CM | POA: Diagnosis not present

## 2023-09-13 DIAGNOSIS — Z809 Family history of malignant neoplasm, unspecified: Secondary | ICD-10-CM | POA: Diagnosis not present

## 2023-09-13 DIAGNOSIS — M1990 Unspecified osteoarthritis, unspecified site: Secondary | ICD-10-CM | POA: Diagnosis not present

## 2023-09-13 DIAGNOSIS — Z7982 Long term (current) use of aspirin: Secondary | ICD-10-CM | POA: Diagnosis not present

## 2023-09-14 ENCOUNTER — Other Ambulatory Visit: Payer: Self-pay | Admitting: Endocrinology

## 2023-09-14 DIAGNOSIS — Z0000 Encounter for general adult medical examination without abnormal findings: Secondary | ICD-10-CM

## 2023-10-05 ENCOUNTER — Ambulatory Visit
Admission: RE | Admit: 2023-10-05 | Discharge: 2023-10-05 | Disposition: A | Discharge status: Home / Self Care | Payer: Medicare HMO | Source: Ambulatory Visit | Attending: Endocrinology | Admitting: Endocrinology

## 2023-10-05 DIAGNOSIS — Z1231 Encounter for screening mammogram for malignant neoplasm of breast: Secondary | ICD-10-CM | POA: Diagnosis not present

## 2023-10-05 DIAGNOSIS — Z0000 Encounter for general adult medical examination without abnormal findings: Secondary | ICD-10-CM

## 2023-10-11 DIAGNOSIS — I10 Essential (primary) hypertension: Secondary | ICD-10-CM | POA: Diagnosis not present

## 2023-10-11 DIAGNOSIS — E049 Nontoxic goiter, unspecified: Secondary | ICD-10-CM | POA: Diagnosis not present

## 2023-10-11 DIAGNOSIS — I2510 Atherosclerotic heart disease of native coronary artery without angina pectoris: Secondary | ICD-10-CM | POA: Diagnosis not present

## 2023-10-11 DIAGNOSIS — Z1212 Encounter for screening for malignant neoplasm of rectum: Secondary | ICD-10-CM | POA: Diagnosis not present

## 2023-10-11 DIAGNOSIS — E785 Hyperlipidemia, unspecified: Secondary | ICD-10-CM | POA: Diagnosis not present

## 2023-10-11 DIAGNOSIS — E1142 Type 2 diabetes mellitus with diabetic polyneuropathy: Secondary | ICD-10-CM | POA: Diagnosis not present

## 2023-10-18 DIAGNOSIS — I700 Atherosclerosis of aorta: Secondary | ICD-10-CM | POA: Diagnosis not present

## 2023-10-18 DIAGNOSIS — K580 Irritable bowel syndrome with diarrhea: Secondary | ICD-10-CM | POA: Diagnosis not present

## 2023-10-18 DIAGNOSIS — E1142 Type 2 diabetes mellitus with diabetic polyneuropathy: Secondary | ICD-10-CM | POA: Diagnosis not present

## 2023-10-18 DIAGNOSIS — E785 Hyperlipidemia, unspecified: Secondary | ICD-10-CM | POA: Diagnosis not present

## 2023-10-18 DIAGNOSIS — Z0000 Encounter for general adult medical examination without abnormal findings: Secondary | ICD-10-CM | POA: Diagnosis not present

## 2023-10-18 DIAGNOSIS — K862 Cyst of pancreas: Secondary | ICD-10-CM | POA: Diagnosis not present

## 2023-10-18 DIAGNOSIS — Z1331 Encounter for screening for depression: Secondary | ICD-10-CM | POA: Diagnosis not present

## 2023-10-18 DIAGNOSIS — G63 Polyneuropathy in diseases classified elsewhere: Secondary | ICD-10-CM | POA: Diagnosis not present

## 2023-10-18 DIAGNOSIS — I10 Essential (primary) hypertension: Secondary | ICD-10-CM | POA: Diagnosis not present

## 2023-10-18 DIAGNOSIS — E049 Nontoxic goiter, unspecified: Secondary | ICD-10-CM | POA: Diagnosis not present

## 2023-10-18 DIAGNOSIS — Z1339 Encounter for screening examination for other mental health and behavioral disorders: Secondary | ICD-10-CM | POA: Diagnosis not present

## 2023-10-18 DIAGNOSIS — K219 Gastro-esophageal reflux disease without esophagitis: Secondary | ICD-10-CM | POA: Diagnosis not present

## 2023-10-18 DIAGNOSIS — R82998 Other abnormal findings in urine: Secondary | ICD-10-CM | POA: Diagnosis not present

## 2023-11-05 DIAGNOSIS — R21 Rash and other nonspecific skin eruption: Secondary | ICD-10-CM | POA: Diagnosis not present

## 2023-11-17 DIAGNOSIS — E119 Type 2 diabetes mellitus without complications: Secondary | ICD-10-CM | POA: Diagnosis not present

## 2024-03-16 ENCOUNTER — Other Ambulatory Visit (HOSPITAL_COMMUNITY)
Admission: RE | Admit: 2024-03-16 | Discharge: 2024-03-16 | Disposition: A | Discharge status: Home / Self Care | Payer: Self-pay | Source: Ambulatory Visit | Attending: Oncology | Admitting: Oncology

## 2024-03-22 DIAGNOSIS — E119 Type 2 diabetes mellitus without complications: Secondary | ICD-10-CM | POA: Diagnosis not present

## 2024-03-27 LAB — GENECONNECT MOLECULAR SCREEN: Genetic Analysis Overall Interpretation: NEGATIVE

## 2024-04-19 DIAGNOSIS — M7061 Trochanteric bursitis, right hip: Secondary | ICD-10-CM | POA: Diagnosis not present

## 2024-04-19 DIAGNOSIS — M1611 Unilateral primary osteoarthritis, right hip: Secondary | ICD-10-CM | POA: Diagnosis not present

## 2024-04-26 DIAGNOSIS — R3915 Urgency of urination: Secondary | ICD-10-CM | POA: Diagnosis not present

## 2024-04-26 DIAGNOSIS — N3943 Post-void dribbling: Secondary | ICD-10-CM | POA: Diagnosis not present

## 2024-05-09 DIAGNOSIS — M1611 Unilateral primary osteoarthritis, right hip: Secondary | ICD-10-CM | POA: Diagnosis not present

## 2024-05-09 DIAGNOSIS — M7061 Trochanteric bursitis, right hip: Secondary | ICD-10-CM | POA: Diagnosis not present

## 2024-05-23 ENCOUNTER — Other Ambulatory Visit

## 2024-05-23 ENCOUNTER — Encounter: Payer: Self-pay | Admitting: Gastroenterology

## 2024-05-23 ENCOUNTER — Ambulatory Visit: Admitting: Gastroenterology

## 2024-05-23 VITALS — BP 150/80 | HR 66 | Ht 63.0 in | Wt 145.0 lb

## 2024-05-23 DIAGNOSIS — K529 Noninfective gastroenteritis and colitis, unspecified: Secondary | ICD-10-CM | POA: Diagnosis not present

## 2024-05-23 MED ORDER — HYDROCORTISONE (PERIANAL) 2.5 % EX CREA: 1.0000 | TOPICAL_CREAM | Freq: Two times a day (BID) | CUTANEOUS | 1 refills | Status: AC

## 2024-05-23 NOTE — Progress Notes (Signed)
 05/23/2024 Michelle Spears [id number] May 26, 1946  Discussed the use of AI scribe software for clinical note transcription with the patient, who gave verbal consent to proceed.  History of Present Illness Michelle Spears is a 78 year old female who presents with chronic diarrhea.  She has experienced chronic diarrhea throughout her life, which has worsened over time. Her bowel movements typically begin with a somewhat regular stool in the morning, followed by multiple episodes of loose, mucousy, and watery stools. This pattern occurs at least three out of five days. She experiences urgency to defecate, leading to accidents, and carries a bag with extra clothes for this reason. Greasy foods are identified as a trigger, and she has adjusted her diet by avoiding regular milk, opting for some lactose-free alternatives. Despite these changes, she continues to experience loose stools. She has tried fiber supplements like Benefiber without significant improvement.  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 She does not have a gallbladder. She has not experienced diverticulitis this year since having her sigmoid resection. She sometimes experiences pain during bowel movements, which improves afterward, and has hemorrhoids exacerbated by frequent bowel movements. She uses Preparation H for relief.  Her current medications include metformin  500 mg twice daily for type 2 diabetes. She has tried Creon pancreatic enzyme supplements provided by her diabetic doctor, which she felt made a slight difference (but was only taking a single 36000 unit capsule daily). She also uses over-the-counter omeprazole  and Imodium as needed. 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 No constant pain, only cramping associated with bowel  movements.  She eats out frequently, which may contribute to her symptoms. She avoids cooking with butter and uses lactose-free milk.   Colonoscopy 08/2022: - The perianal and digital rectal examinations were normal. - Multiple diverticula were found in the sigmoid colon. There was narrowing of the colon in association with the diverticular opening. - Internal hemorrhoids were found. - The exam was otherwise without abnormality on direct and retroflexion views.   Past Medical History:  Diagnosis Date   Allergy    Arthritis    Diabetes mellitus without complication (HCC)    Diverticulosis 2007   Essential hypertension    GERD (gastroesophageal reflux disease)    Glucose intolerance    /Prediabetes -> most recent hemoglobin A1c was 6.6   Headache(784.0)    Hepatic cyst    History of hysterectomy    nonmalignant reasons   Hypertriglyceridemia    Internal hemorrhoids    Kidney stone 1975/2004   PMS (premenstrual syndrome)    PONV (postoperative nausea and vomiting)    Possible Drug rash fromn cipro  and/or metronidazole  12/20/2022   Allergy eval - not clear- delayed rash after cipro  and MTZ - avoid if possible   Seasonal allergies    Spastic colon    Thyroid  goiter    hx of   Past Surgical History:  Procedure Laterality Date   ABDOMINAL HYSTERECTOMY     bladder stent     for stones   bladder stent removal     BREAST BIOPSY Right    2018   childbirthx2     COLONOSCOPY  2013   CT CTA CORONARY W/CA SCORE W/CM &/OR WO/CM  01/2017   Coronary Calcium  Score 3.  Minimal nonobstructive CAD.  Right dominant. Normal PA.   FLEXIBLE SIGMOIDOSCOPY  04/21/2023   Procedure: FLEXIBLE SIGMOIDOSCOPY;  Surgeon: Debby Hila, MD;  Location: [facility];  Service: General;;   GALLBLADDER SURGERY  12/2014   KIDNEY STONE SURGERY  1975   TEMPOROMANDIBULAR JOINT SURGERY  1995   TONSILLECTOMY     TOTAL ABDOMINAL HYSTERECTOMY W/ BILATERAL SALPINGOOPHORECTOMY  1985   WRIST SURGERY  2017    reports  that she quit smoking about 19 years ago. Her smoking use included cigarettes. She started smoking about 31 years ago. She has a 3.6 pack-year smoking history. She has never used smokeless tobacco. She reports current alcohol  use. She reports that she does not use drugs. family history includes CAD in an other family member; Diabetes Mellitus II in her brother; Heart attack (age of onset: 51) in her father; Heart attack (age of onset: 35) in her paternal grandfather; High Cholesterol in her brother; Hyperlipidemia in her mother and another family member; Hypertension in her father; Multiple sclerosis in her sister; Obesity in her father. No Known Allergies    Outpatient Encounter Medications as of 05/23/2024  Medication Sig   acetaminophen  (TYLENOL ) 325 MG tablet Take 650 mg by mouth every 6 (six) hours as needed for headache or moderate pain.   Ascorbic Acid (VITAMIN C) 1000 MG tablet Take 1,000 mg by mouth daily.   aspirin EC 81 MG tablet Take 81 mg by mouth at bedtime.   atorvastatin  (LIPITOR) 10 MG tablet Take 10 mg by mouth 2 (two) times a week. Wednesdays and Sundays   Calcium  Carb-Cholecalciferol (CALCIUM  600 + D PO) Take 1 tablet by mouth daily.   cetirizine  (ZYRTEC ) 10 MG tablet TAKE 1-2 TIMES PER DAY FOR ITCHING (Patient taking differently: Take 10 mg by mouth daily.)   fluticasone (FLONASE) 50 MCG/ACT nasal spray Place 1 spray into both nostrils daily as needed for allergies.   gabapentin  (NEURONTIN ) 300 MG capsule Take 600 mg by mouth at bedtime.   glucose blood test strip One touch verio, test once daily, Dx 250.00   lisinopril -hydrochlorothiazide  (ZESTORETIC ) 10-12.5 MG tablet Take 1 tablet by mouth daily.   loperamide (IMODIUM A-D) 2 MG tablet Take 4 mg by mouth as needed for diarrhea or loose stools.   meclizine  (ANTIVERT ) 25 MG tablet Take 25 mg by mouth as needed for dizziness.   meloxicam  (MOBIC ) 15 MG tablet Take 15 mg by mouth daily.   metFORMIN  (GLUCOPHAGE ) 500 MG tablet Take  500 mg by mouth 2 (two) times daily with a meal.   Multiple Vitamins-Minerals (CENTRUM SILVER PO) Take 1 tablet by mouth daily.   Omega 3 1000 MG CAPS Take 1,000 mg by mouth daily.   OVER THE COUNTER MEDICATION [employer] brand Acid Reducer - one daily   POTASSIUM PO Take 650 mg by mouth daily.   traMADol  (ULTRAM ) 50 MG tablet Take 1-2 tablets (50-100 mg total) by mouth every 6 (six) hours as needed for moderate pain.   [DISCONTINUED] omeprazole  (PRILOSEC) 20 MG capsule Take 20 mg by mouth every morning.   No facility-administered encounter medications on file as of 05/23/2024.    REVIEW OF SYSTEMS  : All other systems reviewed and negative except where noted in the History of Present Illness.   PHYSICAL EXAM: BP (!) 150/80   Pulse 66   Ht 5' 3 (1.6 m)   Wt 145 lb (65.8 kg)   BMI 25.69 kg/m  General: Well developed white female in no acute distress Head: Normocephalic and atraumatic Eyes:  Sclerae anicteric, conjunctiva pink. Ears: Normal auditory acuity Lungs: Clear throughout to auscultation; no W/R/R. Heart: Regular rate and rhythm; no M/R/G. Abdomen: Soft, non-distended.  BS present.  Non-tender. Musculoskeletal: Symmetrical with no gross deformities  Skin: No lesions on visible extremities Extremities: No edema  Neurological: Alert oriented x 4, grossly non-focal Psychological:  Alert and cooperative. 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 Symptoms have persisted for many years, exacerbated by greasy foods. Possible contributing factors include exocrine pancreatic insufficiency and post-cholecystectomy state.  Previous trial of enzyme replacement showed some improvement although was only taking one 36000 unit capsule per day. Discussed potential use of Questran for bile binding and  stool solidification. Consideration of pelvic floor physical therapy for sphincter tone improvement.  - Ordered stool study to assess for exocrine pancreatic insufficiency. - Will consider prescription of enzyme replacement therapy if stool study indicates exocrine pancreatic insufficiency. - Will consider Questran for bile binding and stool solidification if enzyme replacement is not indicated. - Discussed potential referral to pelvic floor physical therapy if stool consistency improves. - Advised on dietary modifications to avoid greasy foods and monitor dairy intake.  Hemorrhoids Exacerbated by frequent bowel movements. Current management with Preparation H. Discussed potential use of hydrocortisone cream for anti-inflammatory effects. - Prescribed hydrocortisone cream for hemorrhoid management.     CC:  Nichole Senior, MD

## 2024-05-23 NOTE — Patient Instructions (Signed)
 Start lactose free diet  Imodium as needed for now.  Your provider has requested that you go to the basement level for lab work before leaving today. Press B on the elevator. The lab is located at the first door on the left as you exit the elevator.

## 2024-05-25 ENCOUNTER — Other Ambulatory Visit

## 2024-05-25 DIAGNOSIS — K529 Noninfective gastroenteritis and colitis, unspecified: Secondary | ICD-10-CM | POA: Diagnosis not present

## 2024-06-01 ENCOUNTER — Ambulatory Visit: Payer: Self-pay | Admitting: Gastroenterology

## 2024-06-01 DIAGNOSIS — M1611 Unilateral primary osteoarthritis, right hip: Secondary | ICD-10-CM | POA: Diagnosis not present

## 2024-06-01 DIAGNOSIS — M25551 Pain in right hip: Secondary | ICD-10-CM | POA: Diagnosis not present

## 2024-06-01 DIAGNOSIS — S76011A Strain of muscle, fascia and tendon of right hip, initial encounter: Secondary | ICD-10-CM | POA: Diagnosis not present

## 2024-06-01 LAB — PANCREATIC ELASTASE, FECAL: Pancreatic Elastase-1, Stool: 674 ug/g (ref >200)

## 2024-06-02 ENCOUNTER — Other Ambulatory Visit: Payer: Self-pay | Admitting: *Deleted

## 2024-06-02 MED ORDER — CHOLESTYRAMINE 4 G PO PACK
4.0000 g | PACK | Freq: Every day | ORAL | 5 refills | Status: AC
Start: 2024-06-02 — End: ?

## 2024-06-06 DIAGNOSIS — K219 Gastro-esophageal reflux disease without esophagitis: Secondary | ICD-10-CM | POA: Diagnosis not present

## 2024-06-06 DIAGNOSIS — E049 Nontoxic goiter, unspecified: Secondary | ICD-10-CM | POA: Diagnosis not present

## 2024-06-06 DIAGNOSIS — G63 Polyneuropathy in diseases classified elsewhere: Secondary | ICD-10-CM | POA: Diagnosis not present

## 2024-06-06 DIAGNOSIS — K580 Irritable bowel syndrome with diarrhea: Secondary | ICD-10-CM | POA: Diagnosis not present

## 2024-06-06 DIAGNOSIS — M8580 Other specified disorders of bone density and structure, unspecified site: Secondary | ICD-10-CM | POA: Diagnosis not present

## 2024-06-06 DIAGNOSIS — I10 Essential (primary) hypertension: Secondary | ICD-10-CM | POA: Diagnosis not present

## 2024-06-06 DIAGNOSIS — E1142 Type 2 diabetes mellitus with diabetic polyneuropathy: Secondary | ICD-10-CM | POA: Diagnosis not present

## 2024-06-06 DIAGNOSIS — E785 Hyperlipidemia, unspecified: Secondary | ICD-10-CM | POA: Diagnosis not present

## 2024-06-06 DIAGNOSIS — I700 Atherosclerosis of aorta: Secondary | ICD-10-CM | POA: Diagnosis not present

## 2024-06-06 DIAGNOSIS — I2510 Atherosclerotic heart disease of native coronary artery without angina pectoris: Secondary | ICD-10-CM | POA: Diagnosis not present

## 2024-06-06 DIAGNOSIS — K862 Cyst of pancreas: Secondary | ICD-10-CM | POA: Diagnosis not present

## 2024-06-06 DIAGNOSIS — N6019 Diffuse cystic mastopathy of unspecified breast: Secondary | ICD-10-CM | POA: Diagnosis not present
